# Patient Record
Sex: Female | Born: 1995 | Race: Black or African American | Hispanic: No | Smoking: Never smoker
Health system: Southern US, Community
[De-identification: ages and names within clinical notes are randomized; demographics above are authoritative.]

## PROBLEM LIST (undated history)

## (undated) DIAGNOSIS — G43909 Migraine, unspecified, not intractable, without status migrainosus: Secondary | ICD-10-CM

---
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
## 2015-01-29 ENCOUNTER — Ambulatory Visit: Payer: Self-pay

## 2015-01-29 ENCOUNTER — Ambulatory Visit
Admission: EM | Admit: 2015-01-29 | Discharge: 2015-01-29 | Disposition: A | Discharge status: Home / Self Care | Payer: PRIVATE HEALTH INSURANCE | Attending: Family Medicine | Admitting: Family Medicine

## 2015-01-29 DIAGNOSIS — S161XXA Strain of muscle, fascia and tendon at neck level, initial encounter: Secondary | ICD-10-CM | POA: Diagnosis not present

## 2015-01-29 MED ORDER — MELOXICAM 15 MG PO TABS: 15.0000 mg | ORAL_TABLET | Freq: Every day | ORAL | Status: DC

## 2015-01-29 MED ORDER — ORPHENADRINE CITRATE ER 100 MG PO TB12: 100.0000 mg | ORAL_TABLET | Freq: Two times a day (BID) | ORAL | Status: DC
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
## 2016-05-25 ENCOUNTER — Ambulatory Visit
Admission: EM | Admit: 2016-05-25 | Discharge: 2016-05-25 | Disposition: A | Discharge status: Home / Self Care | Payer: BLUE CROSS/BLUE SHIELD | Attending: Family Medicine | Admitting: Family Medicine

## 2016-05-25 ENCOUNTER — Encounter: Payer: Self-pay | Admitting: *Deleted

## 2016-05-25 DIAGNOSIS — R0602 Shortness of breath: Secondary | ICD-10-CM | POA: Diagnosis not present

## 2016-05-25 DIAGNOSIS — R002 Palpitations: Secondary | ICD-10-CM | POA: Insufficient documentation

## 2016-05-25 DIAGNOSIS — R51 Headache: Secondary | ICD-10-CM | POA: Diagnosis not present

## 2016-05-25 DIAGNOSIS — Z79899 Other long term (current) drug therapy: Secondary | ICD-10-CM | POA: Diagnosis not present

## 2016-05-25 LAB — CBC WITH DIFFERENTIAL/PLATELET
BASOS ABS: 0.1 10*3/uL (ref 0–0.1)
BASOS PCT: 1 %
Eosinophils Absolute: 0.1 10*3/uL (ref 0–0.7)
Eosinophils Relative: 1 %
HEMATOCRIT: 39.6 % (ref 35.0–47.0)
HEMOGLOBIN: 13 g/dL (ref 12.0–16.0)
Lymphocytes Relative: 36 %
Lymphs Abs: 2.4 10*3/uL (ref 1.0–3.6)
MCH: 28.5 pg (ref 26.0–34.0)
MCHC: 32.9 g/dL (ref 32.0–36.0)
MCV: 86.5 fL (ref 80.0–100.0)
Monocytes Absolute: 0.5 10*3/uL (ref 0.2–0.9)
Monocytes Relative: 8 %
NEUTROS ABS: 3.6 10*3/uL (ref 1.4–6.5)
NEUTROS PCT: 54 %
Platelets: 318 10*3/uL (ref 150–440)
RBC: 4.58 MIL/uL (ref 3.80–5.20)
RDW: 13.1 % (ref 11.5–14.5)
WBC: 6.6 10*3/uL (ref 3.6–11.0)

## 2016-05-25 LAB — BASIC METABOLIC PANEL
ANION GAP: 8 (ref 5–15)
BUN: 10 mg/dL (ref 6–20)
CALCIUM: 9.2 mg/dL (ref 8.9–10.3)
CO2: 28 mmol/L (ref 22–32)
Chloride: 102 mmol/L (ref 101–111)
Creatinine, Ser: 0.59 mg/dL (ref 0.44–1.00)
GFR calc non Af Amer: >60 mL/min (ref >60)
Glucose, Bld: 92 mg/dL (ref 65–99)
Potassium: 3.7 mmol/L (ref 3.5–5.1)
Sodium: 138 mmol/L (ref 135–145)

## 2016-05-25 NOTE — Discharge Instructions (Signed)
Follow up with PCP for further workup.

## 2016-05-25 NOTE — ED Provider Notes (Signed)
[facility]    CSN: [id number] Arrival date & time: 05/25/16  4098     History   Chief Complaint Chief Complaint  Patient presents with  . Palpitations  . Headache  . Shortness of Breath    HPI Gina Parsons is a 21 y.o. female.   The history is provided by the patient.  Palpitations  Palpitations quality:  Fast Onset quality:  Sudden Duration:  10 minutes Timing:  Sporadic Progression:  Resolved (occurred yesterday (last night); currently not feeling rapid heartbeat or palpitations) Chronicity:  New Context comment:  Bending over while at work yesterday Relieved by:  None tried Ineffective treatments:  None tried Associated symptoms: shortness of breath   Associated symptoms: no back pain, no chest pain, no chest pressure, no cough, no diaphoresis, no dizziness, no hemoptysis, no leg pain, no lower extremity edema, no malaise/fatigue, no nausea, no near-syncope and no numbness   Risk factors: no diabetes mellitus, no heart disease, no hx of atrial fibrillation, no hx of DVT, no hx of PE, no hx of thyroid disease, no hypercoagulable state, no hyperthyroidism, no OTC sinus medications and no stress   Headache  Associated symptoms: no back pain, no cough, no dizziness, no nausea, no near-syncope and no numbness   Shortness of Breath  Associated symptoms: headaches   Associated symptoms: no chest pain, no cough, no diaphoresis and no hemoptysis     History reviewed. No pertinent past medical history.  There are no active problems to display for this patient.   History reviewed. No pertinent surgical history.  OB History    No data available       Home Medications    Prior to Admission medications   Medication Sig Start Date End Date Taking? Authorizing Provider  meloxicam (MOBIC) 15 MG tablet Take 1 tablet (15 mg total) by mouth daily. 01/29/15   Hassan Rowan, MD  orphenadrine (NORFLEX) 100 MG tablet Take 1 tablet (100 mg total) by mouth 2 (two)  times daily. 01/29/15   Hassan Rowan, MD    Family History History reviewed. No pertinent family history.  Social History Social History  Substance Use Topics  . Smoking status: Never Smoker  . Smokeless tobacco: Never Used  . Alcohol use No     Allergies   Patient has no known allergies.   Review of Systems Review of Systems  Constitutional: Negative for diaphoresis and malaise/fatigue.  Respiratory: Positive for shortness of breath. Negative for cough and hemoptysis.   Cardiovascular: Positive for palpitations. Negative for chest pain and near-syncope.  Gastrointestinal: Negative for nausea.  Musculoskeletal: Negative for back pain.  Neurological: Positive for headaches. Negative for dizziness and numbness.     Physical Exam Triage Vital Signs ED Triage Vitals  Enc Vitals Group     BP 05/25/16 0959 (!) 146/84     Pulse Rate 05/25/16 0959 75     Resp 05/25/16 0959 16     Temp 05/25/16 0959 98.9 F (37.2 C)     Temp Source 05/25/16 0959 Oral     SpO2 05/25/16 0959 100 %     Weight 05/25/16 1001 213 lb (96.6 kg)     Height 05/25/16 1001 5\' 6"  (1.676 m)     Head Circumference --      Peak Flow --      Pain Score --      Pain Loc --      Pain Edu? --      Excl. in  GC? --    No data found.   Updated Vital Signs BP (!) 146/84 (BP Location: Left Arm)   Pulse 75   Temp 98.9 F (37.2 C) (Oral)   Resp 16   Ht 5\' 6"  (1.676 m)   Wt 213 lb (96.6 kg)   LMP 05/01/2016 (Approximate)   SpO2 100%   BMI 34.38 kg/m   Visual Acuity Right Eye Distance:   Left Eye Distance:   Bilateral Distance:    Right Eye Near:   Left Eye Near:    Bilateral Near:     Physical Exam  Constitutional: She appears well-developed and well-nourished. No distress.  HENT:  Head: Normocephalic and atraumatic.  Right Ear: Tympanic membrane, external ear and ear canal normal.  Left Ear: Tympanic membrane, external ear and ear canal normal.  Nose: No mucosal edema, rhinorrhea, nose  lacerations, sinus tenderness, nasal deformity, septal deviation or nasal septal hematoma. No epistaxis.  No foreign bodies. Right sinus exhibits no maxillary sinus tenderness and no frontal sinus tenderness. Left sinus exhibits no maxillary sinus tenderness and no frontal sinus tenderness.  Mouth/Throat: Uvula is midline, oropharynx is clear and moist and mucous membranes are normal. No oropharyngeal exudate.  Eyes: Conjunctivae and EOM are normal. Pupils are equal, round, and reactive to light. Right eye exhibits no discharge. Left eye exhibits no discharge. No scleral icterus.  Neck: Normal range of motion. Neck supple. No thyromegaly present.  Cardiovascular: Normal rate, regular rhythm and normal heart sounds.   Pulmonary/Chest: Effort normal and breath sounds normal. No respiratory distress. She has no wheezes. She has no rales.  Lymphadenopathy:    She has no cervical adenopathy.  Skin: She is not diaphoretic.  Nursing note and vitals reviewed.    UC Treatments / Results  Labs (all labs ordered are listed, but only abnormal results are displayed) Labs Reviewed  BASIC METABOLIC PANEL  CBC WITH DIFFERENTIAL/PLATELET    EKG  EKG Interpretation None       Radiology No results found.  Procedures .EKG Date/Time: 05/25/2016 1:07 PM Performed by: Payton MccallumONTY, Dilynn Munroe Authorized by: Payton MccallumONTY, Shelly Shoultz   ECG reviewed by ED Physician in the absence of a cardiologist: yes   Previous ECG:    Previous ECG:  Unavailable Interpretation:    Interpretation: normal   Rate:    ECG rate assessment: normal   Rhythm:    Rhythm: sinus rhythm   Ectopy:    Ectopy: none   QRS:    QRS axis:  Normal Conduction:    Conduction: normal   ST segments:    ST segments:  Normal T waves:    T waves: normal     (including critical care time)  Medications Ordered in UC Medications - No data to display   Initial Impression / Assessment and Plan / UC Course  I have reviewed the triage vital signs  and the nursing notes.  Pertinent labs & imaging results that were available during my care of the patient were reviewed by me and considered in my medical decision making (see chart for details).       Final Clinical Impressions(s) / UC Diagnoses   Final diagnoses:  Palpitations    New Prescriptions Discharge Medication List as of 05/25/2016 11:13 AM     1. Lab/ekg results (normal/negative) and diagnosis reviewed with patient 2. Recommend supportive treatment with increased fluids 3. Follow-up with PCP for further work up (and  prn if symptoms worsen/recur)   Payton Mccallumrlando Sheneka Schrom, MD 05/25/16 1308

## 2016-05-25 NOTE — ED Triage Notes (Signed)
Last night, sudden onset heart palpitation, states "it felt like my heart was racing", dyspnea and headache. Symptoms continue this morning but not as pronounced.
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
## 2017-01-03 ENCOUNTER — Emergency Department: Payer: BLUE CROSS/BLUE SHIELD

## 2017-01-03 ENCOUNTER — Emergency Department
Admission: EM | Admit: 2017-01-03 | Discharge: 2017-01-03 | Disposition: A | Discharge status: Home / Self Care | Payer: BLUE CROSS/BLUE SHIELD | Attending: Emergency Medicine | Admitting: Emergency Medicine

## 2017-01-03 DIAGNOSIS — R509 Fever, unspecified: Secondary | ICD-10-CM | POA: Insufficient documentation

## 2017-01-03 DIAGNOSIS — J189 Pneumonia, unspecified organism: Secondary | ICD-10-CM | POA: Diagnosis not present

## 2017-01-03 DIAGNOSIS — R110 Nausea: Secondary | ICD-10-CM | POA: Diagnosis not present

## 2017-01-03 DIAGNOSIS — J181 Lobar pneumonia, unspecified organism: Secondary | ICD-10-CM

## 2017-01-03 DIAGNOSIS — R05 Cough: Secondary | ICD-10-CM | POA: Diagnosis present

## 2017-01-03 LAB — CBC
HEMATOCRIT: 37.2 % (ref 35.0–47.0)
HEMOGLOBIN: 12.7 g/dL (ref 12.0–16.0)
MCH: 29.5 pg (ref 26.0–34.0)
MCHC: 34.2 g/dL (ref 32.0–36.0)
MCV: 86.1 fL (ref 80.0–100.0)
Platelets: 190 10*3/uL (ref 150–440)
RBC: 4.32 MIL/uL (ref 3.80–5.20)
RDW: 13 % (ref 11.5–14.5)
WBC: 5.7 10*3/uL (ref 3.6–11.0)

## 2017-01-03 LAB — BASIC METABOLIC PANEL
Anion gap: 11 (ref 5–15)
BUN: 11 mg/dL (ref 6–20)
CHLORIDE: 102 mmol/L (ref 101–111)
CO2: 22 mmol/L (ref 22–32)
CREATININE: 0.56 mg/dL (ref 0.44–1.00)
Calcium: 8.7 mg/dL — ABNORMAL LOW (ref 8.9–10.3)
GFR calc non Af Amer: >60 mL/min (ref >60)
Glucose, Bld: 95 mg/dL (ref 65–99)
Potassium: 3.6 mmol/L (ref 3.5–5.1)
Sodium: 135 mmol/L (ref 135–145)

## 2017-01-03 MED ORDER — DEXTROSE 5 % IV SOLN
1.0000 g | Freq: Once | INTRAVENOUS | Status: AC
  Administered 2017-01-03: 1 g via INTRAVENOUS
  Filled 2017-01-03: qty 10

## 2017-01-03 MED ORDER — BENZONATATE 100 MG PO CAPS: 100.0000 mg | ORAL_CAPSULE | Freq: Four times a day (QID) | ORAL | 0 refills | Status: DC | PRN

## 2017-01-03 MED ORDER — AZITHROMYCIN 500 MG PO TABS
500.0000 mg | ORAL_TABLET | Freq: Once | ORAL | Status: AC
  Administered 2017-01-03: 500 mg via ORAL
  Filled 2017-01-03: qty 1

## 2017-01-03 MED ORDER — ACETAMINOPHEN 500 MG PO TABS
1000.0000 mg | ORAL_TABLET | Freq: Once | ORAL | Status: AC
  Administered 2017-01-03: 1000 mg via ORAL
  Filled 2017-01-03: qty 2

## 2017-01-03 MED ORDER — AZITHROMYCIN 250 MG PO TABS: ORAL_TABLET | ORAL | 0 refills | Status: AC

## 2017-01-03 NOTE — ED Triage Notes (Signed)
Patient reports cough, fever, chills and nausea for several days.

## 2017-01-03 NOTE — Discharge Instructions (Signed)
Please follow up with the acute care clinic °

## 2017-01-03 NOTE — ED Notes (Signed)

## 2017-01-03 NOTE — ED Notes (Signed)
Patient transported to X-ray 

## 2017-01-03 NOTE — ED Provider Notes (Signed)
[facility] Emergency Department Provider Note   ____________________________________________   First MD Initiated Contact with Patient 01/03/17 0400     (approximate)  I have reviewed the triage vital signs and the nursing notes.   HISTORY  Chief Complaint URI    HPI Gina Parsons is a 21 y.o. female Who comes into the hospital today with a cough for the past 4 days. The patient reports that she had fevers chills and nausea. She has taken NyQuil, DayQuil and Tylenol but she is unable to get better. She tried to figure out what's going on so she decided to come into the hospital. She reports that her cough is been dry and has only had some stuff, occasionally. The patient denies any sick contacts that she knows of. She did not check her temperature at home. She last took NyQuil yesterday around 7 PM. The patient has chest pain when she coughs but denies any pain at this time. She denies any shortness of breath as well. The patient is here today for evaluation.   No past medical history on file.  There are no active problems to display for this patient.   No past surgical history on file.  Prior to Admission medications   Medication Sig Start Date End Date Taking? Authorizing Provider  azithromycin (ZITHROMAX Z-PAK) 250 MG tablet Take 2 tablets (500 mg) on  Day 1,  followed by 1 tablet (250 mg) once daily on Days 2 through 5. 01/03/17 01/08/17  Rebecka ApleyWebster, Allison P, MD  benzonatate (TESSALON PERLES) 100 MG capsule Take 1 capsule (100 mg total) by mouth every 6 (six) hours as needed for cough. 01/03/17   Rebecka ApleyWebster, Allison P, MD    Allergies Patient has no known allergies.  No family history on file.  Social History Social History  Substance Use Topics  . Smoking status: Never Smoker  . Smokeless tobacco: Never Used  . Alcohol use No    Review of Systems  Constitutional:  fever/chills Eyes: No visual changes. ENT: No sore throat. Cardiovascular:  Denies chest pain. Respiratory: cough Gastrointestinal: nausea withNo abdominal pain. no vomiting.  No diarrhea.  No constipation. Genitourinary: Negative for dysuria. Musculoskeletal: Negative for back pain. Skin: Negative for rash. Neurological: Negative for headaches, focal weakness or numbness.   ____________________________________________   PHYSICAL EXAM:  VITAL SIGNS: ED Triage Vitals  Enc Vitals Group     BP 01/03/17 0300 (!) 158/65     Pulse Rate 01/03/17 0300 (!) 111     Resp 01/03/17 0300 18     Temp 01/03/17 0300 (!) 101.9 F (38.8 C)     Temp Source 01/03/17 0300 Oral     SpO2 01/03/17 0300 98 %     Weight 01/03/17 0301 203 lb (92.1 kg)     Height 01/03/17 0301 5\' 6"  (1.676 m)     Head Circumference --      Peak Flow --      Pain Score --      Pain Loc --      Pain Edu? --      Excl. in GC? --     Constitutional: Alert and oriented. Well appearing and in mild distress. Eyes: Conjunctivae are normal. PERRL. EOMI. Head: Atraumatic. Nose: No congestion/rhinnorhea. Mouth/Throat: Mucous membranes are moist.  Oropharynx non-erythematous. Cardiovascular: tachycardia regular rhythm. Grossly normal heart sounds.  Good peripheral circulation. Respiratory: Normal respiratory effort.  No retractions. Lungs CTAB. Gastrointestinal: Soft and nontender. No distention. positive bowel sounds  Musculoskeletal: No lower extremity tenderness nor edema.   Neurologic:  Normal speech and language.  Skin:  Skin is warm, dry and intact.  Psychiatric: Mood and affect are normal.   ____________________________________________   LABS (all labs ordered are listed, but only abnormal results are displayed)  Labs Reviewed  BASIC METABOLIC PANEL - Abnormal; Notable for the following:       Result Value   Calcium 8.7 (*)    All other components within normal limits  CULTURE, BLOOD (ROUTINE X 2)  CULTURE, BLOOD (ROUTINE X 2)  CBC    ____________________________________________  EKG  none ____________________________________________  RADIOLOGY  Dg Chest 2 View  Result Date: 01/03/2017 CLINICAL DATA:  Cough and fever for several days EXAM: CHEST  2 VIEW COMPARISON:  None. FINDINGS: Right middle lobe consolidation, typical of pneumonia. No effusions. Left lung is clear. Normal hilar, mediastinal and cardiac contours. IMPRESSION: Right middle lobe pneumonia. Electronically Signed   By: Ellery Plunk M.D.   On: 01/03/2017 04:29    ____________________________________________   PROCEDURES  Procedure(s) performed: None  Procedures  Critical Care performed: No  ____________________________________________   INITIAL IMPRESSION / ASSESSMENT AND PLAN / ED COURSE  Pertinent labs & imaging results that were available during my care of the patient were reviewed by me and considered in my medical decision making (see chart for details).  this is a 21 year old female who comes into the hospital today with a fever and cough for the past 4 days. I sent the patient for a chest x-ray and appears that she has a right middle lobe pneumonia. I will check some blood work and I will give the patient a dose of ceftriaxone and azithromycin. The patient did receive some Tylenol for her fever. She will be reassessed and if she still feels improved and her vital signs were improved she'll be discharged when she's receive her medications.     the patient's blood work is unremarkable and her repeat vital signs are improved. She'll be discharged home to follow-up with the acute care clinic. ____________________________________________   FINAL CLINICAL IMPRESSION(S) / ED DIAGNOSES  Final diagnoses:  Community acquired pneumonia of right middle lobe of lung (HCC)      NEW MEDICATIONS STARTED DURING THIS VISIT:  New Prescriptions   AZITHROMYCIN (ZITHROMAX Z-PAK) 250 MG TABLET    Take 2 tablets (500 mg) on  Day 1,   followed by 1 tablet (250 mg) once daily on Days 2 through 5.   BENZONATATE (TESSALON PERLES) 100 MG CAPSULE    Take 1 capsule (100 mg total) by mouth every 6 (six) hours as needed for cough.     Note:  This document was prepared using Dragon voice recognition software and may include unintentional dictation errors.    Rebecka Apley, MD 01/03/17 [phone]

## 2017-01-03 NOTE — ED Notes (Signed)
Pt given incentive spirometer with education and teach back.

## 2017-01-08 LAB — CULTURE, BLOOD (ROUTINE X 2)
Culture: NO GROWTH
Culture: NO GROWTH
SPECIAL REQUESTS: ADEQUATE

## 2017-03-09 ENCOUNTER — Ambulatory Visit
Admission: EM | Admit: 2017-03-09 | Discharge: 2017-03-09 | Disposition: A | Discharge status: Home / Self Care | Payer: BLUE CROSS/BLUE SHIELD | Attending: Emergency Medicine | Admitting: Emergency Medicine

## 2017-03-09 ENCOUNTER — Other Ambulatory Visit: Payer: Self-pay

## 2017-03-09 DIAGNOSIS — J029 Acute pharyngitis, unspecified: Secondary | ICD-10-CM

## 2017-03-09 LAB — RAPID STREP SCREEN (MED CTR MEBANE ONLY): STREPTOCOCCUS, GROUP A SCREEN (DIRECT): NEGATIVE

## 2017-03-09 MED ORDER — FLUTICASONE PROPIONATE 50 MCG/ACT NA SUSP: 2.0000 | Freq: Every day | NASAL | 0 refills | Status: AC

## 2017-03-09 MED ORDER — IBUPROFEN 600 MG PO TABS: 600.0000 mg | ORAL_TABLET | Freq: Four times a day (QID) | ORAL | 0 refills | Status: DC | PRN

## 2017-03-09 NOTE — ED Triage Notes (Signed)
Patient complains of sore throat and swollen tonsils that started a couple days ago.

## 2017-03-09 NOTE — Discharge Instructions (Signed)
your rapid strep was negative today, so we have sent off a throat culture.  We will contact you and call in the appropriate antibiotics if your culture comes back positive for an infection requiring antibiotic treatment.  Give us a working phone number. 1 gram of Tylenol and 600 mg ibuprofen together 3-4 times a day as needed for pain.  Make sure you drink plenty of extra fluids.  Some people find salt water gargles and  Traditional Medicinal's "Throat Coat" tea helpful. Take 5 mL of liquid Benadryl and 5 mL of Maalox. Mix it together, and then hold it in your mouth for as long as you can and then swallow. You may do this 4 times a day.   ° °Go to www.goodrx.com to look up your medications. This will give you a list of where you can find your prescriptions at the most affordable prices. Or ask the pharmacist what the cash price is, or if they have any other discount programs available to help make your medication more affordable. This can be less expensive than what you would pay with insurance.   °

## 2017-03-09 NOTE — ED Provider Notes (Signed)
HPI  SUBJECTIVE:  Patient reports sore throat starting 2-3 days ago. Sx worse with swallowing.  Sx better with nothing. Has been taking Goody's powder and Tylenol w/ o relief.  No + Swollen neck glands    + Congestion, rhinorrhea.  No postnasal drip or cough No Myalgias No Headache No Rash     No Recent Strep or mono exposure No Abdominal Pain No reflux sxs No Allergy sxs  No Breathing difficulty, voice changes, sensation of throat swelling shut  no Drooling No Trismus No abx in past month.  No antipyretic in past 4-6 hrs  As medical history negative for recurrent strep, mono, diabetes, hypertension.  She has a 3 of GERD but states it is not bothering her, allergies in the spring. LMP, 2 days ago.  Denies possibility of being pregnant. WGN:FAOZHYQPMD:Patient, No Pcp Per    History reviewed. No pertinent past medical history.  History reviewed. No pertinent surgical history.  History reviewed. No pertinent family history.  Social History   Tobacco Use  . Smoking status: Never Smoker  . Smokeless tobacco: Never Used  Substance Use Topics  . Alcohol use: No  . Drug use: Not on file    No current facility-administered medications for this encounter.   Current Outpatient Medications:  .  fluticasone (FLONASE) 50 MCG/ACT nasal spray, Place 2 sprays daily into both nostrils., Disp: 16 g, Rfl: 0 .  ibuprofen (ADVIL,MOTRIN) 600 MG tablet, Take 1 tablet (600 mg total) every 6 (six) hours as needed by mouth., Disp: 30 tablet, Rfl: 0  No Known Allergies   ROS  As noted in HPI.   Physical Exam  BP (!) 148/86 (BP Location: Left Arm)   Pulse 66   Temp 98.4 F (36.9 C) (Oral)   Resp 18   Ht 5\' 6"  (1.676 m)   Wt 202 lb (91.6 kg)   LMP 03/07/2017   SpO2 100%   BMI 32.60 kg/m   Constitutional: Well developed, well nourished, no acute distress Eyes:  EOMI, conjunctiva normal bilaterally HENT: Normocephalic, atraumatic,mucus membranes moist. +  nasal congestion +  erythematous oropharynx - enlarged tonsils - exudates. Uvula midline.  Respiratory: Normal inspiratory effort Cardiovascular: Normal rate, no murmurs, rubs, gallops GI: nondistended, nontender. No appreciable splenomegaly skin: No rash, skin intact Lymph: + cervical LN  Musculoskeletal: no deformities Neurologic: Alert & oriented x 3, no focal neuro deficits Psychiatric: Speech and behavior appropriate.   ED Course   Medications - No data to display  Orders Placed This Encounter  Procedures  . Rapid strep screen    Standing Status:   Standing    Number of Occurrences:   1    Order Specific Question:   Patient immune status    Answer:   Normal  . Culture, group A strep    Standing Status:   Standing    Number of Occurrences:   1    Results for orders placed or performed during the hospital encounter of 03/09/17 (from the past 24 hour(s))  Rapid strep screen     Status: None   Collection Time: 03/09/17  9:04 AM  Result Value Ref Range   Streptococcus, Group A Screen (Direct) NEGATIVE NEGATIVE   No results found.  ED Clinical Impression  Pharyngitis, unspecified etiology   ED Assessment/Plan   Rapid strep negative. Obtaining throat culture to guide antibiotic treatment. Discussed this with patient. We'll contact them if culture is positive, and will call in Appropriate antibiotics. Patient home with ibuprofen, Tylenol,  benadryl/Maalox mixture. Patient to followup with PMD when necessary, will refer to local primary care resources.  Discussed labs,  MDM, plan and followup with patient. Discussed sn/sx that should prompt return to the ED. patient agrees with plan.   Meds ordered this encounter  Medications  . fluticasone (FLONASE) 50 MCG/ACT nasal spray    Sig: Place 2 sprays daily into both nostrils.    Dispense:  16 g    Refill:  0  . ibuprofen (ADVIL,MOTRIN) 600 MG tablet    Sig: Take 1 tablet (600 mg total) every 6 (six) hours as needed by mouth.    Dispense:  30  tablet    Refill:  0     *This clinic note was created using Scientist, clinical (histocompatibility and immunogenetics)Dragon dictation software. Therefore, there may be occasional mistakes despite careful proofreading.    Domenick GongMortenson, Randie Tallarico, MD 03/09/17 [phone]

## 2017-03-11 LAB — CULTURE, GROUP A STREP (THRC)

## 2018-09-01 IMAGING — CR DG CHEST 2V
2 series · 2 of 2 positions shown · non-contrast
Comparison: None.

CLINICAL DATA: Cough and fever for several days

EXAM:
CHEST  2 VIEW

[chest pa]
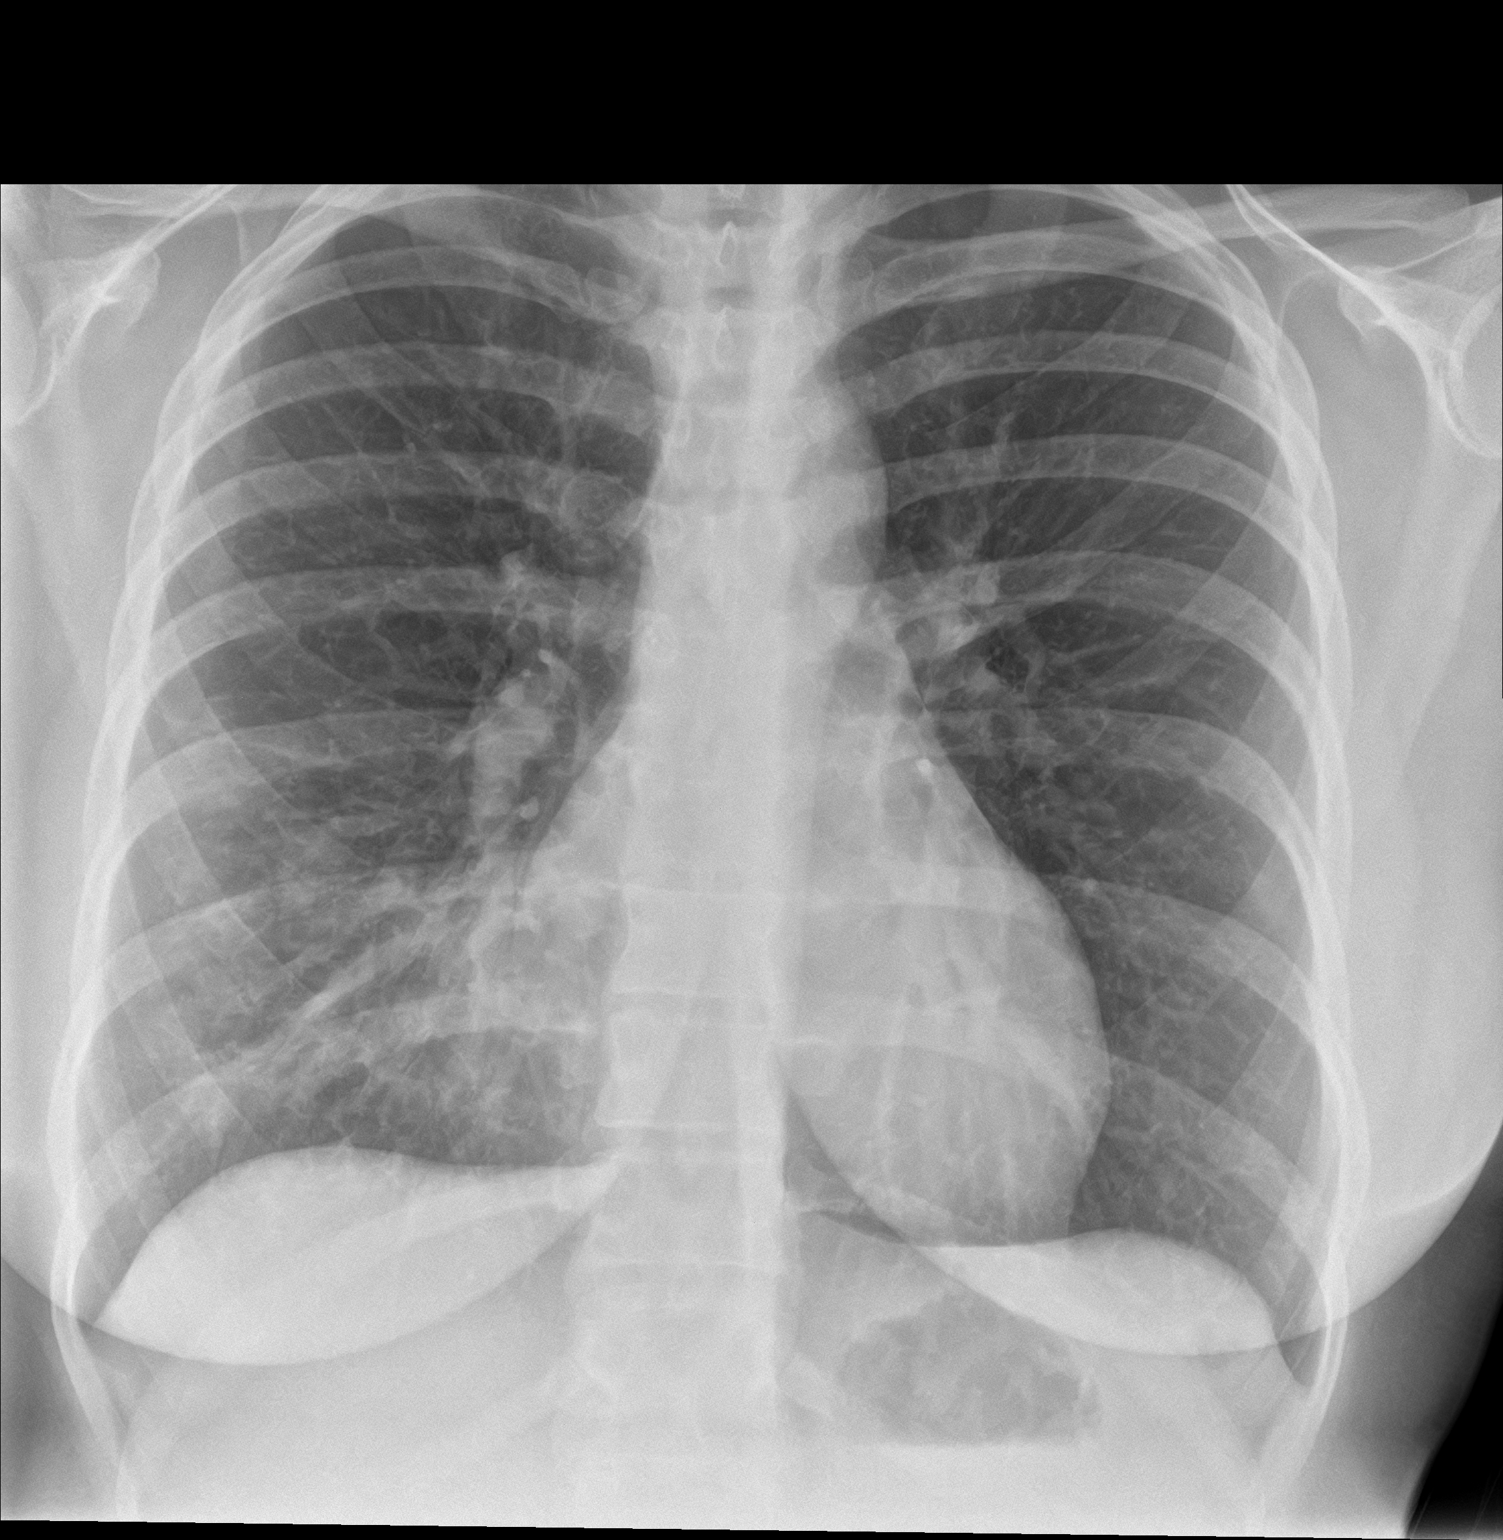

[chest lat]
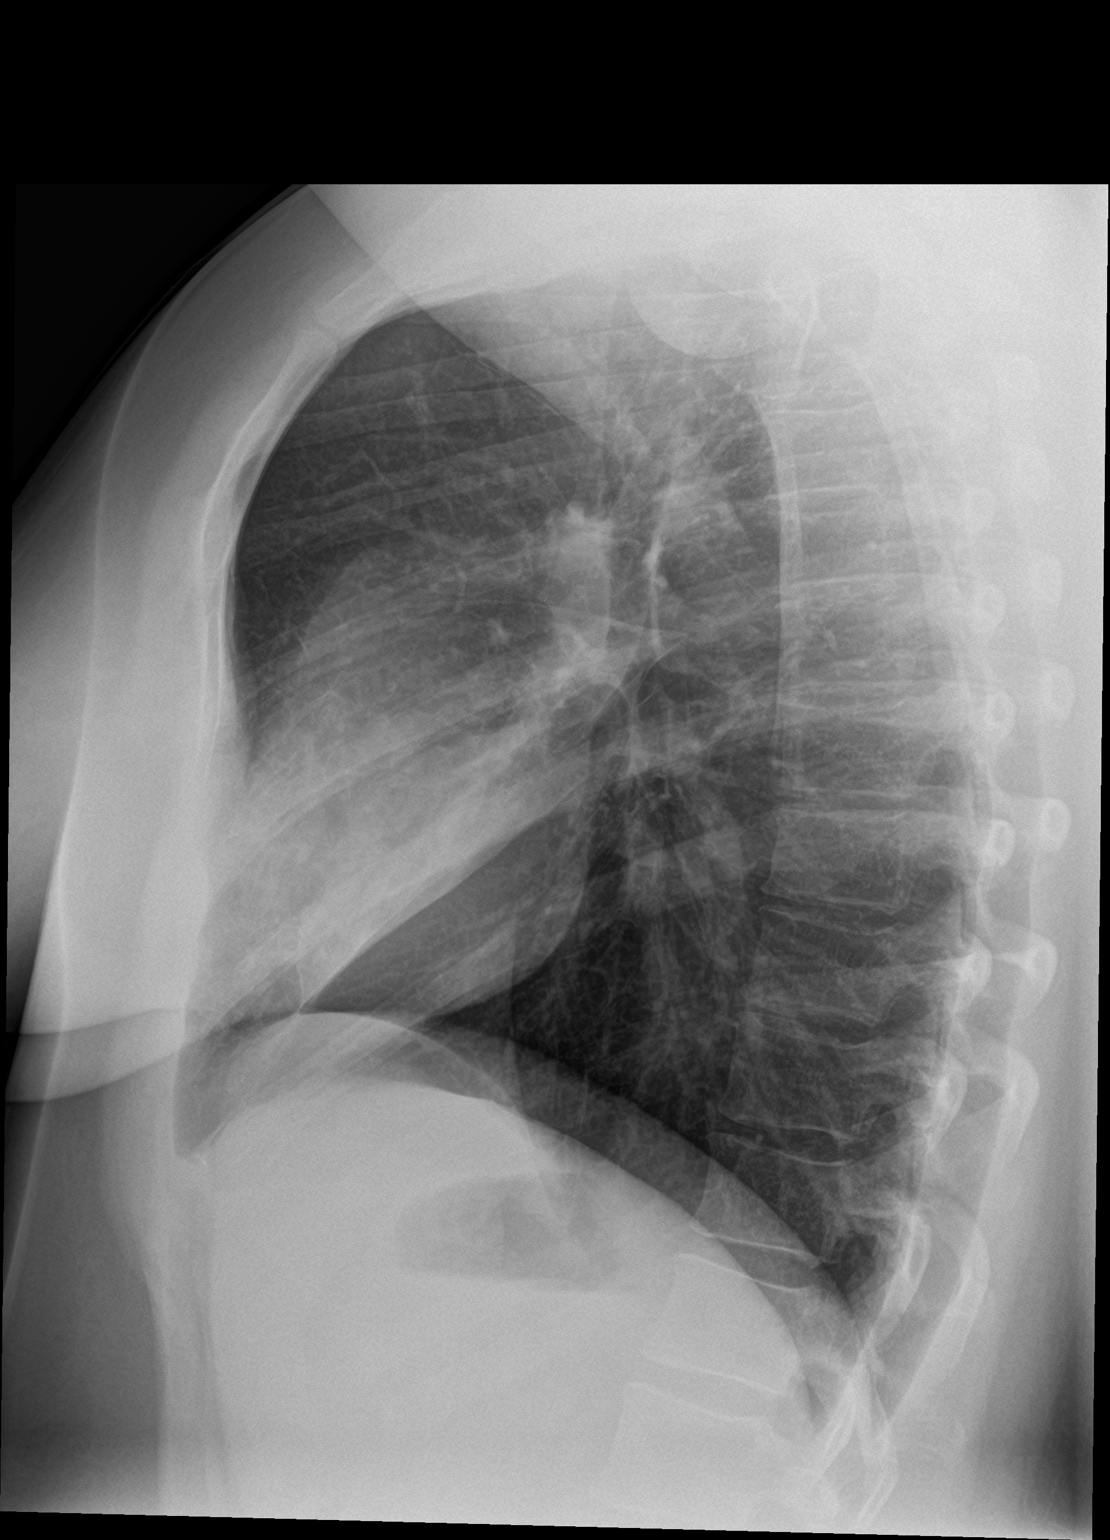

[2 of 2 positions shown; findings below may reference images not displayed]

FINDINGS: Right middle lobe consolidation, typical of pneumonia. No effusions.
Left lung is clear. Normal hilar, mediastinal and cardiac contours.
IMPRESSION: Right middle lobe pneumonia.

## 2019-05-03 NOTE — L&D Delivery Note (Signed)
Delivery Note  Gina Parsons is a G1P1001 at [redacted]w[redacted]d with an LMP of 02/20/19, consistent with early [place].   First Stage: Labor onset: 1230 Induction: Misoprostol, IP folely, oxytocin, AROM  Analgesia /Anesthesia intrapartum: epidural AROM at 1232  Second Stage: Complete dilation at 11/12/2019 2218 Onset of pushing at 11/12/2019 2225 FHR second stage 125bpm with moderate variability, intermittent lates and variables  Gina Parsons presented to L&D for scheduled IOL d/t gestational HTN.  Magnesium sulfate infusion was started for severe range blood pressures requiring treatment with antihypertensives. Her labor became more painful after AROM.  She progressed quickly in the active phase from 6cm to C/C/+2.  Gina Parsons had a spontaneous urge to push and had good maternal effort.   Delivery of a viable baby girl 11/13/2019 at 0029 by CNM Delivery of fetal head in OA position with restitution to LOT. No nuchal cord;  Anterior then posterior shoulders delivered easily with gentle downward traction. Right compound hand presentation noted with delivery.  Baby placed on mom's chest, and attended to by baby nurse.  Cord double clamped after cessation of pulsation, cut by father of baby.   Cord blood sample collected: O neg   Third Stage: Oxytocin bolus started after delivery of infant for hemorrhage prophylaxis  Placenta delivered intact with 3 VC @ 0035 Placenta disposition: discarded Uterine tone firm / bleeding moderate  1st degree right vaginal sidewall and right labial laceration identified  Anesthesia for repair: epidural Repair with 2-0 Chromic CT for vaginal sidewall laceration, 3-0 Chromic CT used for repair of right labial laceration.  Est. Blood Loss (mL): 150 ml  Complications: Preeclampsia with severe features, magnesium sulfate infusion  Mom to postpartum.  Baby to Couplet care / Skin to Skin.  Newborn: Birth Weight: 6lbs 7.7oz, 2940 g Apgar Scores: 8/9 Feeding planned: Breast    ---------- Gina Parsons, CNM Certified Nurse Midwife [facility] OB[facility]N [facility]

## 2019-06-10 DIAGNOSIS — O0993 Supervision of high risk pregnancy, unspecified, third trimester: Secondary | ICD-10-CM | POA: Insufficient documentation

## 2019-06-10 LAB — OB RESULTS CONSOLE RUBELLA ANTIBODY, IGM: Rubella: IMMUNE

## 2019-06-10 LAB — OB RESULTS CONSOLE HEPATITIS B SURFACE ANTIGEN: Hepatitis B Surface Ag: NEGATIVE

## 2019-06-10 LAB — OB RESULTS CONSOLE VARICELLA ZOSTER ANTIBODY, IGG: Varicella: IMMUNE

## 2019-11-05 LAB — OB RESULTS CONSOLE GBS: GBS: NEGATIVE

## 2019-11-05 LAB — OB RESULTS CONSOLE HIV ANTIBODY (ROUTINE TESTING): HIV: NONREACTIVE

## 2019-11-05 LAB — OB RESULTS CONSOLE GC/CHLAMYDIA
Chlamydia: NEGATIVE
Gonorrhea: NEGATIVE

## 2019-11-06 ENCOUNTER — Other Ambulatory Visit: Payer: Self-pay | Admitting: Obstetrics and Gynecology

## 2019-11-06 NOTE — Progress Notes (Signed)
Dating: EDD: 11/27/19  by LMP: 02/20/19 and c/w [place] at 11+1 wks.   Preg c/b:  1. History of anxiety   EPDS at NOB - 11   No current medications   EPDS at 27 weeks 12 - pt feels stable and declines medication or counseling at this time 2. Gestational Hypertension- new onset at 31.6wks  6/1 CBC- platelets 247, CMP-wnl and P/C ratio: 80  Growth [place] ordered 6/1  Home b/p cuff  Weekly NST/AFI  3. RH negative, rhogam given at 28wks 4. Prepregnancy BMI 34  Early glucola-89  Baseline CMP and urine P/C ratio - WNL  Low dose ASA therapy   Prenatal Labs: Blood type/Rh O Neg  Antibody screen Neg, rhogam given 09/03/19  Rubella Immune  Varicella Immune  RPR NR  HBsAg Neg  HIV NR  GC neg  Chlamydia neg  Genetic screening negative  1 hour GTT 85   3 hour GTT  n/a  GBS  pending done 11/05/19   Tdap/flu: declined contraception: undecided Feeding plan: breast

## 2019-11-11 ENCOUNTER — Other Ambulatory Visit: Payer: Self-pay | Admitting: Certified Nurse Midwife

## 2019-11-11 ENCOUNTER — Other Ambulatory Visit
Admission: RE | Admit: 2019-11-11 | Discharge: 2019-11-11 | Disposition: A | Discharge status: Home / Self Care | Payer: BC Managed Care – PPO | Source: Ambulatory Visit | Attending: Obstetrics and Gynecology | Admitting: Obstetrics and Gynecology

## 2019-11-11 ENCOUNTER — Encounter: Payer: Self-pay | Admitting: Certified Nurse Midwife

## 2019-11-11 ENCOUNTER — Other Ambulatory Visit: Payer: Self-pay

## 2019-11-11 DIAGNOSIS — Z20822 Contact with and (suspected) exposure to covid-19: Secondary | ICD-10-CM | POA: Insufficient documentation

## 2019-11-11 LAB — SARS CORONAVIRUS 2 (TAT 6-24 HRS): SARS Coronavirus 2: NEGATIVE

## 2019-11-12 ENCOUNTER — Inpatient Hospital Stay: Payer: BC Managed Care – PPO | Admitting: Anesthesiology

## 2019-11-12 ENCOUNTER — Inpatient Hospital Stay
Admission: EM | Admit: 2019-11-12 | Discharge: 2019-11-15 | DRG: 807 | Disposition: A | Discharge status: Home / Self Care | Payer: BC Managed Care – PPO | Attending: Obstetrics and Gynecology | Admitting: Obstetrics and Gynecology

## 2019-11-12 ENCOUNTER — Encounter: Payer: Self-pay | Admitting: Anesthesiology

## 2019-11-12 ENCOUNTER — Encounter: Payer: Self-pay | Admitting: Obstetrics and Gynecology

## 2019-11-12 DIAGNOSIS — O322XX0 Maternal care for transverse and oblique lie, not applicable or unspecified: Secondary | ICD-10-CM | POA: Diagnosis present

## 2019-11-12 DIAGNOSIS — Z20822 Contact with and (suspected) exposure to covid-19: Secondary | ICD-10-CM | POA: Diagnosis present

## 2019-11-12 DIAGNOSIS — O9902 Anemia complicating childbirth: Secondary | ICD-10-CM | POA: Diagnosis present

## 2019-11-12 DIAGNOSIS — O133 Gestational [pregnancy-induced] hypertension without significant proteinuria, third trimester: Secondary | ICD-10-CM | POA: Diagnosis present

## 2019-11-12 DIAGNOSIS — O134 Gestational [pregnancy-induced] hypertension without significant proteinuria, complicating childbirth: Secondary | ICD-10-CM | POA: Diagnosis present

## 2019-11-12 DIAGNOSIS — O165 Unspecified maternal hypertension, complicating the puerperium: Secondary | ICD-10-CM | POA: Diagnosis present

## 2019-11-12 DIAGNOSIS — Z6791 Unspecified blood type, Rh negative: Secondary | ICD-10-CM | POA: Diagnosis not present

## 2019-11-12 DIAGNOSIS — D649 Anemia, unspecified: Secondary | ICD-10-CM | POA: Diagnosis present

## 2019-11-12 DIAGNOSIS — Z3A37 37 weeks gestation of pregnancy: Secondary | ICD-10-CM

## 2019-11-12 DIAGNOSIS — O26893 Other specified pregnancy related conditions, third trimester: Secondary | ICD-10-CM | POA: Diagnosis present

## 2019-11-12 DIAGNOSIS — D72829 Elevated white blood cell count, unspecified: Secondary | ICD-10-CM | POA: Diagnosis present

## 2019-11-12 HISTORY — DX: Migraine, unspecified, not intractable, without status migrainosus: G43.909

## 2019-11-12 LAB — COMPREHENSIVE METABOLIC PANEL
ALT: 12 U/L (ref 0–44)
ALT: 11 U/L (ref 0–44)
ALT: 11 U/L (ref 0–44)
AST: 18 U/L (ref 15–41)
AST: 19 U/L (ref 15–41)
AST: 19 U/L (ref 15–41)
Albumin: 2.9 g/dL — ABNORMAL LOW (ref 3.5–5.0)
Albumin: 2.7 g/dL — ABNORMAL LOW (ref 3.5–5.0)
Albumin: 2.8 g/dL — ABNORMAL LOW (ref 3.5–5.0)
Alkaline Phosphatase: 91 U/L (ref 38–126)
Alkaline Phosphatase: 90 U/L (ref 38–126)
Alkaline Phosphatase: 88 U/L (ref 38–126)
Anion gap: 10 (ref 5–15)
Anion gap: 10 (ref 5–15)
Anion gap: 11 (ref 5–15)
BUN: 8 mg/dL (ref 6–20)
BUN: 5 mg/dL — ABNORMAL LOW (ref 6–20)
BUN: <5 mg/dL — ABNORMAL LOW (ref 6–20)
CO2: 22 mmol/L (ref 22–32)
CO2: 23 mmol/L (ref 22–32)
CO2: 23 mmol/L (ref 22–32)
Calcium: 8.7 mg/dL — ABNORMAL LOW (ref 8.9–10.3)
Calcium: 8.7 mg/dL — ABNORMAL LOW (ref 8.9–10.3)
Calcium: 8.1 mg/dL — ABNORMAL LOW (ref 8.9–10.3)
Chloride: 103 mmol/L (ref 98–111)
Chloride: 102 mmol/L (ref 98–111)
Chloride: 101 mmol/L (ref 98–111)
Creatinine, Ser: 0.59 mg/dL (ref 0.44–1.00)
Creatinine, Ser: 0.65 mg/dL (ref 0.44–1.00)
Creatinine, Ser: 0.66 mg/dL (ref 0.44–1.00)
GFR calc Af Amer: >60 mL/min (ref >60)
GFR calc Af Amer: >60 mL/min (ref >60)
GFR calc Af Amer: >60 mL/min (ref >60)
GFR calc non Af Amer: >60 mL/min (ref >60)
GFR calc non Af Amer: >60 mL/min (ref >60)
GFR calc non Af Amer: >60 mL/min (ref >60)
Glucose, Bld: 80 mg/dL (ref 70–99)
Glucose, Bld: 94 mg/dL (ref 70–99)
Glucose, Bld: 104 mg/dL — ABNORMAL HIGH (ref 70–99)
Potassium: 3.7 mmol/L (ref 3.5–5.1)
Potassium: 3.6 mmol/L (ref 3.5–5.1)
Potassium: 4 mmol/L (ref 3.5–5.1)
Sodium: 135 mmol/L (ref 135–145)
Sodium: 135 mmol/L (ref 135–145)
Sodium: 135 mmol/L (ref 135–145)
Total Bilirubin: 1 mg/dL (ref 0.3–1.2)
Total Bilirubin: 1.1 mg/dL (ref 0.3–1.2)
Total Bilirubin: 1.3 mg/dL — ABNORMAL HIGH (ref 0.3–1.2)
Total Protein: 6.6 g/dL (ref 6.5–8.1)
Total Protein: 6.2 g/dL — ABNORMAL LOW (ref 6.5–8.1)
Total Protein: 6.4 g/dL — ABNORMAL LOW (ref 6.5–8.1)

## 2019-11-12 LAB — TYPE AND SCREEN
ABO/RH(D): O NEG
Antibody Screen: NEGATIVE

## 2019-11-12 LAB — CBC
HCT: 29.4 % — ABNORMAL LOW (ref 36.0–46.0)
HCT: 28.2 % — ABNORMAL LOW (ref 36.0–46.0)
HCT: 29.6 % — ABNORMAL LOW (ref 36.0–46.0)
Hemoglobin: 9.3 g/dL — ABNORMAL LOW (ref 12.0–15.0)
Hemoglobin: 8.9 g/dL — ABNORMAL LOW (ref 12.0–15.0)
Hemoglobin: 9.3 g/dL — ABNORMAL LOW (ref 12.0–15.0)
MCH: 25.5 pg — ABNORMAL LOW (ref 26.0–34.0)
MCH: 25.6 pg — ABNORMAL LOW (ref 26.0–34.0)
MCH: 25.5 pg — ABNORMAL LOW (ref 26.0–34.0)
MCHC: 31.6 g/dL (ref 30.0–36.0)
MCHC: 31.6 g/dL (ref 30.0–36.0)
MCHC: 31.4 g/dL (ref 30.0–36.0)
MCV: 80.8 fL (ref 80.0–100.0)
MCV: 81 fL (ref 80.0–100.0)
MCV: 81.3 fL (ref 80.0–100.0)
Platelets: 261 10*3/uL (ref 150–400)
Platelets: 229 10*3/uL (ref 150–400)
Platelets: 233 10*3/uL (ref 150–400)
RBC: 3.64 MIL/uL — ABNORMAL LOW (ref 3.87–5.11)
RBC: 3.48 MIL/uL — ABNORMAL LOW (ref 3.87–5.11)
RBC: 3.64 MIL/uL — ABNORMAL LOW (ref 3.87–5.11)
RDW: 14.1 % (ref 11.5–15.5)
RDW: 14.3 % (ref 11.5–15.5)
RDW: 14.3 % (ref 11.5–15.5)
WBC: 7.6 10*3/uL (ref 4.0–10.5)
WBC: 8.3 10*3/uL (ref 4.0–10.5)
WBC: 9.8 10*3/uL (ref 4.0–10.5)
nRBC: 0 % (ref 0.0–0.2)
nRBC: 0 % (ref 0.0–0.2)
nRBC: 0 % (ref 0.0–0.2)

## 2019-11-12 LAB — ABO/RH: ABO/RH(D): O NEG

## 2019-11-12 LAB — PROTEIN / CREATININE RATIO, URINE
Creatinine, Urine: 239 mg/dL
Protein Creatinine Ratio: 0.06 mg/mg{Cre} (ref 0.00–0.15)
Total Protein, Urine: 14 mg/dL

## 2019-11-12 LAB — MAGNESIUM: Magnesium: 4.4 mg/dL — ABNORMAL HIGH (ref 1.7–2.4)

## 2019-11-12 LAB — URIC ACID: Uric Acid, Serum: 5.9 mg/dL (ref 2.5–7.1)

## 2019-11-12 LAB — LACTATE DEHYDROGENASE: LDH: 150 U/L (ref 98–192)

## 2019-11-12 MED ORDER — BUTORPHANOL TARTRATE 1 MG/ML IJ SOLN
1.0000 mg | INTRAMUSCULAR | Status: DC | PRN
  Administered 2019-11-12: 1 mg via INTRAVENOUS
  Filled 2019-11-12: qty 1

## 2019-11-12 MED ORDER — MISOPROSTOL 25 MCG QUARTER TABLET
25.0000 ug | ORAL_TABLET | ORAL | Status: DC | PRN
  Administered 2019-11-12: 25 ug via VAGINAL
  Filled 2019-11-12: qty 1

## 2019-11-12 MED ORDER — AMMONIA AROMATIC IN INHA
RESPIRATORY_TRACT | Status: AC
  Filled 2019-11-12: qty 10

## 2019-11-12 MED ORDER — FENTANYL 2.5 MCG/ML W/ROPIVACAINE 0.15% IN NS 100 ML EPIDURAL (ARMC): 12.0000 mL/h | EPIDURAL | Status: DC

## 2019-11-12 MED ORDER — OXYTOCIN-SODIUM CHLORIDE 30-0.9 UT/500ML-% IV SOLN
1.0000 m[IU]/min | INTRAVENOUS | Status: DC
  Administered 2019-11-12: 2 m[IU]/min via INTRAVENOUS

## 2019-11-12 MED ORDER — MISOPROSTOL 25 MCG QUARTER TABLET
25.0000 ug | ORAL_TABLET | ORAL | Status: DC | PRN
  Administered 2019-11-12: 25 ug via BUCCAL
  Filled 2019-11-12: qty 1

## 2019-11-12 MED ORDER — MAGNESIUM SULFATE 40 GM/1000ML IV SOLN
2.0000 g/h | INTRAVENOUS | Status: DC
  Administered 2019-11-12 – 2019-11-13 (×2): 2 g/h via INTRAVENOUS
  Filled 2019-11-12 (×2): qty 1000

## 2019-11-12 MED ORDER — PHENYLEPHRINE 40 MCG/ML (10ML) SYRINGE FOR IV PUSH (FOR BLOOD PRESSURE SUPPORT): 80.0000 ug | PREFILLED_SYRINGE | INTRAVENOUS | Status: DC | PRN

## 2019-11-12 MED ORDER — LACTATED RINGERS IV SOLN: INTRAVENOUS | Status: DC

## 2019-11-12 MED ORDER — LACTATED RINGERS IV SOLN
500.0000 mL | INTRAVENOUS | Status: DC | PRN
  Administered 2019-11-12: 500 mL via INTRAVENOUS

## 2019-11-12 MED ORDER — MISOPROSTOL 200 MCG PO TABS
ORAL_TABLET | ORAL | Status: AC
  Filled 2019-11-12: qty 4

## 2019-11-12 MED ORDER — HYDRALAZINE HCL 20 MG/ML IJ SOLN: 10.0000 mg | INTRAMUSCULAR | Status: DC | PRN

## 2019-11-12 MED ORDER — LIDOCAINE HCL (PF) 1 % IJ SOLN
INTRAMUSCULAR | Status: DC | PRN
  Administered 2019-11-12: 2 mL

## 2019-11-12 MED ORDER — CALCIUM GLUCONATE 10 % IV SOLN
INTRAVENOUS | Status: AC
  Filled 2019-11-12: qty 10

## 2019-11-12 MED ORDER — FENTANYL 2.5 MCG/ML W/ROPIVACAINE 0.15% IN NS 100 ML EPIDURAL (ARMC)
EPIDURAL | Status: DC | PRN
  Administered 2019-11-12: 12 mL/h via EPIDURAL

## 2019-11-12 MED ORDER — OXYTOCIN 10 UNIT/ML IJ SOLN
INTRAMUSCULAR | Status: AC
  Filled 2019-11-12: qty 2

## 2019-11-12 MED ORDER — SODIUM CHLORIDE 0.9 % IV SOLN
INTRAVENOUS | Status: DC | PRN
  Administered 2019-11-12 (×3): 5 mL via EPIDURAL

## 2019-11-12 MED ORDER — ONDANSETRON HCL 4 MG/2ML IJ SOLN: 4.0000 mg | Freq: Four times a day (QID) | INTRAMUSCULAR | Status: DC | PRN

## 2019-11-12 MED ORDER — MAGNESIUM SULFATE BOLUS VIA INFUSION
4.0000 g | Freq: Once | INTRAVENOUS | Status: AC
  Administered 2019-11-12: 4 g via INTRAVENOUS
  Filled 2019-11-12: qty 1000

## 2019-11-12 MED ORDER — LIDOCAINE HCL (PF) 1 % IJ SOLN
30.0000 mL | INTRAMUSCULAR | Status: DC | PRN
  Filled 2019-11-12: qty 30

## 2019-11-12 MED ORDER — LABETALOL HCL 5 MG/ML IV SOLN
40.0000 mg | INTRAVENOUS | Status: DC | PRN
  Administered 2019-11-12: 40 mg via INTRAVENOUS
  Filled 2019-11-12: qty 8

## 2019-11-12 MED ORDER — OXYTOCIN-SODIUM CHLORIDE 30-0.9 UT/500ML-% IV SOLN
2.5000 [IU]/h | INTRAVENOUS | Status: DC
  Filled 2019-11-12 (×2): qty 500

## 2019-11-12 MED ORDER — LABETALOL HCL 5 MG/ML IV SOLN
20.0000 mg | INTRAVENOUS | Status: DC | PRN
  Administered 2019-11-12 – 2019-11-14 (×2): 20 mg via INTRAVENOUS
  Filled 2019-11-12 (×2): qty 4

## 2019-11-12 MED ORDER — EPHEDRINE 5 MG/ML INJ
10.0000 mg | INTRAVENOUS | Status: DC | PRN
  Administered 2019-11-12: 10 mg via INTRAVENOUS
  Filled 2019-11-12: qty 4

## 2019-11-12 MED ORDER — LABETALOL HCL 5 MG/ML IV SOLN: 80.0000 mg | INTRAVENOUS | Status: DC | PRN

## 2019-11-12 MED ORDER — OXYTOCIN BOLUS FROM INFUSION: 333.0000 mL | Freq: Once | INTRAVENOUS | Status: DC

## 2019-11-12 MED ORDER — EPHEDRINE 5 MG/ML INJ: 10.0000 mg | INTRAVENOUS | Status: DC | PRN

## 2019-11-12 MED ORDER — FENTANYL 2.5 MCG/ML W/ROPIVACAINE 0.15% IN NS 100 ML EPIDURAL (ARMC)
EPIDURAL | Status: AC
  Filled 2019-11-12: qty 100

## 2019-11-12 MED ORDER — LACTATED RINGERS IV SOLN
500.0000 mL | Freq: Once | INTRAVENOUS | Status: AC
  Administered 2019-11-12: 500 mL via INTRAVENOUS

## 2019-11-12 MED ORDER — SOD CITRATE-CITRIC ACID 500-334 MG/5ML PO SOLN: 30.0000 mL | ORAL | Status: DC | PRN

## 2019-11-12 MED ORDER — TERBUTALINE SULFATE 1 MG/ML IJ SOLN
0.2500 mg | Freq: Once | INTRAMUSCULAR | Status: AC | PRN
  Administered 2019-11-12: 0.25 mg via SUBCUTANEOUS
  Filled 2019-11-12: qty 1

## 2019-11-12 MED ORDER — DIPHENHYDRAMINE HCL 50 MG/ML IJ SOLN: 12.5000 mg | INTRAMUSCULAR | Status: DC | PRN

## 2019-11-12 MED ORDER — ACETAMINOPHEN 325 MG PO TABS: 650.0000 mg | ORAL_TABLET | ORAL | Status: DC | PRN

## 2019-11-12 NOTE — H&P (Signed)
OB History & Physical   History of Present Illness:  Chief Complaint:   HPI:  MANDA HOLSTAD is a 24 y.o. G1P0 female at [redacted]w[redacted]d dated by LMP.  She presents to L&D for scheduled IOL for gestational HTN  Active FM Denies contractions prior to admission.  Now having irregular cramping  Denies LOF or blood show  Denies headaches, changes in vision, or RUQ pain   Pregnancy Issues: 1. History of anxiety 2. Gestational Hypertension  3. Rh negative 4. Prepregnancy BMI 34   Patient Active Problem List   Diagnosis Date Noted   Gestational hypertension w/o significant proteinuria in 3rd trimester 11/12/2019    Maternal Medical History:   Past Medical History:  Diagnosis Date   Migraines     History reviewed. No pertinent surgical history.  No Known Allergies  Prior to Admission medications   Medication Sig Start Date End Date Taking? Authorizing Provider  aspirin 325 MG tablet Take 325 mg by mouth daily.   Yes [provider]  ondansetron (ZOFRAN) 24 MG tablet Take 24 mg by mouth once.   Yes [provider]  fluticasone (FLONASE) 50 MCG/ACT nasal spray Place 2 sprays daily into both nostrils. 03/09/17   Domenick Gong, MD  ibuprofen (ADVIL,MOTRIN) 600 MG tablet Take 1 tablet (600 mg total) every 6 (six) hours as needed by mouth. 03/09/17   Domenick Gong, MD     Prenatal care site:  [facility]  Social History: She  reports that she has never smoked. She has never used smokeless tobacco. She reports that she does not drink alcohol and does not use drugs.  Family History: No pertinent history, denies history of gyn cancers    Review of Systems: A full review of systems was performed and negative except as noted in the HPI.     Physical Exam:  Vital Signs: BP (!) 144/98    Pulse 70    Temp 98.7 F (37.1 C) (Oral)    Resp 18    Ht 5\' 6"  (1.676 m)    Wt 97.1 kg    LMP 02/20/2019 (Approximate)    BMI 34.54 kg/m    General: no  acute distress.  HEENT: normocephalic, atraumatic Heart: regular rate & rhythm.  No murmurs/rubs/gallops Lungs: clear to auscultation bilaterally, normal respiratory effort Abdomen: soft, gravid, non-tender;  EFW: 6lbs 1/2 lbs  Pelvic:   External: Normal external female genitalia  Cervix: Dilation: 1.5 / Effacement (%): 70 / Station: -2    Extremities: non-tender, symmetric, no edema bilaterally.  DTRs: 2+/2+  Neurologic: Alert & oriented x 3.    Results for orders placed or performed during the hospital encounter of 11/12/19 (from the past 24 hour(s))  Protein / creatinine ratio, urine     Status: None   Collection Time: 11/12/19 12:21 AM  Result Value Ref Range   Creatinine, Urine 239 mg/dL   Total Protein, Urine 14 mg/dL   Protein Creatinine Ratio 0.06 0.00 - 0.15 mg/mg[Cre]  CBC     Status: Abnormal   Collection Time: 11/12/19 12:56 AM  Result Value Ref Range   WBC 7.6 4.0 - 10.5 K/uL   RBC 3.64 (L) 3.87 - 5.11 MIL/uL   Hemoglobin 9.3 (L) 12.0 - 15.0 g/dL   HCT 11/14/19 (L) 36 - 46 %   MCV 80.8 80.0 - 100.0 fL   MCH 25.5 (L) 26.0 - 34.0 pg   MCHC 31.6 30.0 - 36.0 g/dL   RDW 97.3 53.2 -  15.5 %   Platelets 261 150 - 400 K/uL   nRBC 0.0 0.0 - 0.2 %  Comprehensive metabolic panel     Status: Abnormal   Collection Time: 11/12/19 12:56 AM  Result Value Ref Range   Sodium 135 135 - 145 mmol/L   Potassium 3.7 3.5 - 5.1 mmol/L   Chloride 103 98 - 111 mmol/L   CO2 22 22 - 32 mmol/L   Glucose, Bld 80 70 - 99 mg/dL   BUN 8 6 - 20 mg/dL   Creatinine, Ser 1.19 0.44 - 1.00 mg/dL   Calcium 8.7 (L) 8.9 - 10.3 mg/dL   Total Protein 6.6 6.5 - 8.1 g/dL   Albumin 2.9 (L) 3.5 - 5.0 g/dL   AST 18 15 - 41 U/L   ALT 12 0 - 44 U/L   Alkaline Phosphatase 91 38 - 126 U/L   Total Bilirubin 1.0 0.3 - 1.2 mg/dL   GFR calc non Af Amer >60 >60 mL/min   GFR calc Af Amer >60 >60 mL/min   Anion gap 10 5 - 15  Type and screen     Status: None   Collection Time: 11/12/19 12:56 AM  Result Value Ref  Range   ABO/RH(D) O NEG    Antibody Screen NEG    Sample Expiration      11/15/2019,2359 Performed at [facility], [address] [place], [place] [address]   ABO/Rh     Status: None   Collection Time: 11/12/19  1:50 AM  Result Value Ref Range   ABO/RH(D)      O NEG Performed at [facility], [address] [place], [place] [address]     Pertinent Results:  Prenatal Labs: Blood type/Rh O neg  Antibody screen Neg, rhogam given 09/03/2019  Rubella Immune  Varicella Immune  RPR NR  HBsAg Neg  HIV NR  GC neg  Chlamydia neg  Genetic screening negative  1 hour GTT 85  3 hour GTT N/A  GBS Negative    Tdap: declined   FHT: 120bpm, moderate variability, accels present, decels absent  TOCO: contractions 1-45min, mild to palpation  SVE:  2-3/70/-1/soft/mid ->previously FT/50/-3/posterior    Cephalic by leopolds and SVE     Assessment:  AUSTINA CONSTANTIN is a 24 y.o. G1P0 female at [redacted]w[redacted]d with gestational HTN.   Plan:  1. Admit to Labor & Delivery; consents reviewed and obtained  2. Fetal Well being  - Fetal Tracing: cat 1 - Group B Streptococcus ppx indicated: Not indicated, GBS neg - Presentation: cephalic confirmed by SVE    3. Routine OB: - Prenatal labs reviewed, as above - Rh neg - CBC, T&S, RPR on admit - Clear fluids, IVF  4.Induction of Labor -  External toco in place -  Pelvis adequate for trial of labor  -  Plan for induction with misoprostol then oxytocin  - S/P cervical balloon  -  Plan for continuous fetal monitoring  -  Maternal pain control as desired - Anticipate vaginal delivery  5. Gestational HTN - Preeclampsia labs WNL - Mild range blood pressures noted.   - Severe range b/p noted with follow up b/p in mild range   5. Post Partum Planning: - Infant feeding: breast - Contraception: TBD   [name], CNM 11/12/19 8:18 AM  Margaretmary Eddy, CNM Certified Nurse Midwife [place]  [facility]

## 2019-11-12 NOTE — Progress Notes (Signed)
Labor Progress Note   Gina Parsons is a 24 y.o. G1P0 at [redacted]w[redacted]d by LMP admitted for induction of labor due to gestational hypertension.  Subjective: feeling more comfortable after epidural  Objective: BP 111/63   Pulse (!) 108   Temp 97.6 F (36.4 C) (Oral)   Resp 15   Ht 5\' 6"  (1.676 m)   Wt 97.1 kg   LMP 02/20/2019 (Approximate)   SpO2 97%   BMI 34.54 kg/m  Notable VS details: hypotensive following epidural  Fetal Assessment: FHT:  FHR: 120 bpm, variability: moderate,  accelerations:  Present,  decelerations:  Present prolong decel, intermittent variables and earlies  Category/reactivity:  Category II UC:   regular, every 2-5 minutes SVE:   4/80/-2/soft/mid Membrane status: AROM at 1232 Amniotic color: clear fluid   Labs: Lab Results  Component Value Date   WBC 8.3 11/12/2019   HGB 8.9 (L) 11/12/2019   HCT 28.2 (L) 11/12/2019   MCV 81.0 11/12/2019   PLT 229 11/12/2019    Assessment / Plan: Induction of labor d/t gestational HTN, s/p epidural placment, mag sulfate infusing.  Dr. 11/14/2019 updated on prolong decel.  Strip reviewed.  Will place IUPC to better monitor contractions.    Labor: Pitocin turned off d/t prolong decel.  Will plan to restart in [name] - 1 hour. Preeclampsia:  on magnesium sulfate Fetal Wellbeing:  Category II Pain Control:  Epidural I/D:  No s/s, afebrile  , CNM 11/12/2019, 4:53 PM

## 2019-11-12 NOTE — Anesthesia Preprocedure Evaluation (Signed)
Anesthesia Evaluation  Patient identified by MRN, date of birth, ID band Patient awake    Reviewed: Allergy & Precautions, H&P , NPO status , Patient's Chart, lab work & pertinent test results  Airway Mallampati: III  TM Distance: >3 FB Neck ROM: full    Dental  (+) Teeth Intact   Pulmonary neg pulmonary ROS,           Cardiovascular Exercise Tolerance: Good hypertension (gestational),      Neuro/Psych  Headaches,    GI/Hepatic negative GI ROS,   Endo/Other    Renal/GU   negative genitourinary   Musculoskeletal   Abdominal   Peds  Hematology negative hematology ROS (+)   Anesthesia Other Findings Past Medical History: No date: Migraines  History reviewed. No pertinent surgical history.  BMI    Body Mass Index: 34.54 kg/m      Reproductive/Obstetrics (+) Pregnancy                             Anesthesia Physical Anesthesia Plan  ASA: II  Anesthesia Plan: Epidural   Post-op Pain Management:    Induction:   PONV Risk Score and Plan:   Airway Management Planned:   Additional Equipment:   Intra-op Plan:   Post-operative Plan:   Informed Consent: I have reviewed the patients History and Physical, chart, labs and discussed the procedure including the risks, benefits and alternatives for the proposed anesthesia with the patient or authorized representative who has indicated his/her understanding and acceptance.       Plan Discussed with: Anesthesiologist  Anesthesia Plan Comments:         Anesthesia Quick Evaluation

## 2019-11-12 NOTE — Progress Notes (Signed)
Labor Progress Note  Gina Parsons is a 24 y.o. G1P0 at [redacted]w[redacted]d by LMP admitted for induction of labor due to gestational hypertension.  Subjective: comfortable with epidural  Objective: BP 120/65   Pulse 82   Temp 97.6 F (36.4 C) (Oral)   Resp 15   Ht 5\' 6"  (1.676 m)   Wt 97.1 kg   LMP 02/20/2019 (Approximate)   SpO2 100%   BMI 34.54 kg/m  Notable VS details: reviewed, no elevated blood pressures  DTR 2+/2+, 1 beat of clonus present   Fetal Assessment: FHT:  FHR: 105 bpm, variability: moderate,  accelerations:  Present,  decelerations:  Present earlies Category/reactivity:  Category II UC:   regular, every 3-5 minutes - IUPC placed, MVU 140  SVE:   Unchanged  Membrane status: AROM at 1232 Amniotic color: Clear  Labs: Lab Results  Component Value Date   WBC 8.3 11/12/2019   HGB 8.9 (L) 11/12/2019   HCT 28.2 (L) 11/12/2019   MCV 81.0 11/12/2019   PLT 229 11/12/2019    Assessment / Plan: Protracted latent phase, pitocin restarted, IUPC placed. Dr. 11/14/2019 updated on FHR and interventions.  Reviewed tracing. Low baseline at 105bpm with moderate variability and accels.  Will continue to titrate pitocin for adequate labor.    Labor: Pitocin restarted, will titrate for adequate labor  Preeclampsia:  on magnesium sulfate, will recheck labs at 2000  Fetal Wellbeing:  Category II Pain Control:  Epidural I/D:  Afebrile, AROM at 1232  [name], CNM 11/12/2019, 6:46 PM

## 2019-11-12 NOTE — Anesthesia Procedure Notes (Signed)
Epidural Patient location during procedure: OB Start time: 11/12/2019 3:12 PM End time: 11/12/2019 3:24 PM  Staffing Anesthesiologist: Karleen Hampshire, MD Performed: anesthesiologist   Preanesthetic Checklist Completed: patient identified, IV checked, site marked, risks and benefits discussed, surgical consent, monitors and equipment checked, pre-op evaluation and timeout performed  Epidural Patient position: sitting Prep: ChloraPrep Patient monitoring: heart rate, continuous pulse ox and blood pressure Approach: midline Location: L4-L5 Injection technique: LOR saline  Needle:  Needle type: Tuohy  Needle gauge: 18 G Needle length: 9 cm and 9 Needle insertion depth: 7 cm Catheter type: closed end flexible Catheter size: 20 Guage Catheter at skin depth: 11 cm Test dose: negative and Other  Assessment Events: blood not aspirated, injection not painful, no injection resistance, no paresthesia and negative IV test  Additional Notes Risks and benefits of procedure discussed with patient.  Risks including but not limited to infection, spinal/epidural hematoma, nerve injury, post dural puncture headache, and inadequate/failed block.  Patient expressed understanding and consented to epidural placement. Negative dural puncture.  Negative aspiration.  Negative paresthesia on injection.  Dose given in divided aliquots.  Patient tolerated the procedure well with no immediate complications.   Reason for block:procedure for pain

## 2019-11-12 NOTE — Progress Notes (Signed)
Labor Progress Note  Gina Parsons is a 24 y.o. G1P0 at [redacted]w[redacted]d by LMP admitted for induction of labor due to gestational HTN.  Subjective: feeling cramping, a little more uncomfortable with contractions   Objective: BP (!) 147/75    Pulse 70    Temp 98.1 F (36.7 C) (Oral)    Resp 18    Ht 5\' 6"  (1.676 m)    Wt 97.1 kg    LMP 02/20/2019 (Approximate)    BMI 34.54 kg/m  Notable VS details: reviewed, no severe range blood pressures   Fetal Assessment: FHT:  FHR: 120 bpm, variability: moderate,  accelerations:  Present,  decelerations:  Absent, FSE placed d/t difficulty differentiating between maternal and fetal HR.  Category/reactivity:  Category I UC:   regular, every 2-3 minutes, mild to palpation  SVE:  3/80/-1/soft/posterior Membrane status: AROM at 1232 Amniotic color: Clear - scant amount of fluid   Labs: Lab Results  Component Value Date   WBC 8.3 11/12/2019   HGB 8.9 (L) 11/12/2019   HCT 28.2 (L) 11/12/2019   MCV 81.0 11/12/2019   PLT 229 11/12/2019    Assessment / Plan: Induction of labor due to gestational hypertension,  progressing well on pitocin  Labor: Progressing on pitocin, will continue to titrate  Preeclampsia:  no signs or symptoms of toxicity and labs stable Fetal Wellbeing:  Category I - FSE placed d/t difficulty maintaining FHR versus maternal HR.  Several periods are noted to trace maternal heart rate.  Confirmed at bedside and audibly in sync with maternal HR.   Pain Control:  Labor support without medications I/D:  n/a  11/14/2019, CNM 11/12/2019, 12:42 PM

## 2019-11-12 NOTE — Progress Notes (Signed)
  Chaplain On-Call responded to Order Requisition for Advance Directives information for patient.  At 2010, Chaplain met patient and provided the AD documents to her. Encouraged reading and discussions with family and health care team.  Patient is sleepy, and is hopeful that her baby can be delivered on tomorrow.  Orason Jahfari Ambers M.Div., [facility]

## 2019-11-12 NOTE — Progress Notes (Signed)
FSE removed

## 2019-11-12 NOTE — Progress Notes (Signed)
Labor Progress Note  Gina Parsons is a 24 y.o. G1P0 at [redacted]w[redacted]d by LMP admitted for induction of labor due to gestational HTN.  Subjective: feeling increased pressure and urge to push  Objective: BP (!) 115/58   Pulse 77   Temp (!) 97.3 F (36.3 C) (Oral)   Resp 16   Ht 5\' 6"  (1.676 m)   Wt 97.1 kg   LMP 02/20/2019 (Approximate)   SpO2 100%   BMI 34.54 kg/m  Notable VS details: reviewed  Fetal Assessment: FHT:  FHR: 120 bpm, variability: moderate,  accelerations:  Present,  decelerations:  Present Intermittent earlies and variables Category/reactivity:  Category II UC:   regular, every 2-3 minutes SVE:   10/100/+2 Membrane status: AROM at 1232 Amniotic color: Clear   Labs: Lab Results  Component Value Date   WBC 9.8 11/12/2019   HGB 9.3 (L) 11/12/2019   HCT 29.6 (L) 11/12/2019   MCV 81.3 11/12/2019   PLT 233 11/12/2019    Assessment / Plan: Induction of labor d/t gest HTN, progressing normally, will start pushing   Labor: Progressing normally Preeclampsia:  on magnesium sulfate Fetal Wellbeing:  Category II Pain Control:  Epidural I/D:  Afebrile   11/14/2019, CNM 11/12/2019, 10:33 PM

## 2019-11-12 NOTE — Progress Notes (Signed)
Labor Progress Note  Gina Parsons is a 24 y.o. G1P0 at [redacted]w[redacted]d by LMP admitted for induction of labor due to gestational hypertension.  Subjective: feeling more pressure   Objective: BP (!) 115/58    Pulse 77    Temp (!) 97.3 F (36.3 C) (Oral)    Resp 16    Ht 5\' 6"  (1.676 m)    Wt 97.1 kg    LMP 02/20/2019 (Approximate)    SpO2 100%    BMI 34.54 kg/m  Notable VS details: reviewed   Fetal Assessment: FHT:  FHR: 120 bpm, variability: moderate,  accelerations:  Present,  decelerations:  Present intermittent early decelerations, prolong deceleration Category/reactivity:  Category II UC:   regular, every 2-5 minutes, MVU's range from 140-200 SVE:   6/90/-1/soft/ant Membrane status: AROM at 1232 Amniotic color: clear   Labs: Lab Results  Component Value Date   WBC 9.8 11/12/2019   HGB 9.3 (L) 11/12/2019   HCT 29.6 (L) 11/12/2019   MCV 81.3 11/12/2019   PLT 233 11/12/2019    Assessment / Plan: Induction of labor due to gestational hypertension,  progressing well on pitocin  Labor: Progressing on pitcocin  Dr. 11/14/2019 updated on status.   Preeclampsia:  on magnesium sulfate and decreased to 1 gram/hour d/t low blood pressure and low FHR baseline Fetal Wellbeing:  Category II - prolong decel noted on right side.   Repositioned to left side.  Decelerations noted more predominately when Gina Parsons is on her back or right side.   Pain Control:  Epidural I/D:  Afebrile, AROM at 1232   [name], [name] 11/12/2019, 9:35 PM

## 2019-11-12 NOTE — Progress Notes (Signed)
Labor Progress Note  Gina Parsons is a 24 y.o. G1P0 at [redacted]w[redacted]d by LMP admitted for induction of labor due to gestational HTN.  Subjective: hurting more with contractions, IVPM was helpful but feeling more nauseous.  Denies headache, changes in vision, or RUQ pain.    Objective: BP (!) 169/101 Comment: CNM aware  Pulse 90   Temp 98.1 F (36.7 C) (Oral)   Resp 18   Ht 5\' 6"  (1.676 m)   Wt 97.1 kg   LMP 02/20/2019 (Approximate)   BMI 34.54 kg/m  Notable VS details: reviewed.  2 severe range b/p noted, treated with IV Labetalol.   Fetal Assessment: FHT:  FHR: 120 bpm, variability: moderate,  accelerations:  Present,  decelerations:  Absent Category/reactivity:  Category I UC:   regular, every 2-3 minutes SVE:   Deferred  Membrane status: AROM at 1232 Amniotic color: clear, scant amount   Labs: Lab Results  Component Value Date   WBC 8.3 11/12/2019   HGB 8.9 (L) 11/12/2019   HCT 28.2 (L) 11/12/2019   MCV 81.0 11/12/2019   PLT 229 11/12/2019    Assessment / Plan: Induction of labor due to gestational hypertension,  progressing well on pitocin. Plan of care discussed with Dr. 11/14/2019.  Will start magnesium sulfate infusion d/t severe range blood pressures.    Labor: Progressing normally, reporting increased pain with contractions.  Preeclampsia:  Multiple severe range b/p's requiring treatment with IV labetalol.  Starting Mag Sulfate infusion. Fetal Wellbeing:  Category I Pain Control:  IV pain meds I/D:  no s/s   [name], CNM 11/12/2019, 1:42 PM

## 2019-11-13 ENCOUNTER — Encounter: Payer: Self-pay | Admitting: Obstetrics and Gynecology

## 2019-11-13 LAB — CBC
HCT: 29.3 % — ABNORMAL LOW (ref 36.0–46.0)
Hemoglobin: 9.5 g/dL — ABNORMAL LOW (ref 12.0–15.0)
MCH: 26 pg (ref 26.0–34.0)
MCHC: 32.4 g/dL (ref 30.0–36.0)
MCV: 80.1 fL (ref 80.0–100.0)
Platelets: 273 10*3/uL (ref 150–400)
RBC: 3.66 MIL/uL — ABNORMAL LOW (ref 3.87–5.11)
RDW: 14.1 % (ref 11.5–15.5)
WBC: 21.9 10*3/uL — ABNORMAL HIGH (ref 4.0–10.5)
nRBC: 0 % (ref 0.0–0.2)

## 2019-11-13 LAB — COMPREHENSIVE METABOLIC PANEL
ALT: 15 U/L (ref 0–44)
AST: 36 U/L (ref 15–41)
Albumin: 2.8 g/dL — ABNORMAL LOW (ref 3.5–5.0)
Alkaline Phosphatase: 86 U/L (ref 38–126)
Anion gap: 10 (ref 5–15)
BUN: <5 mg/dL — ABNORMAL LOW (ref 6–20)
CO2: 21 mmol/L — ABNORMAL LOW (ref 22–32)
Calcium: 7.6 mg/dL — ABNORMAL LOW (ref 8.9–10.3)
Chloride: 105 mmol/L (ref 98–111)
Creatinine, Ser: 0.72 mg/dL (ref 0.44–1.00)
GFR calc Af Amer: >60 mL/min (ref >60)
GFR calc non Af Amer: >60 mL/min (ref >60)
Glucose, Bld: 140 mg/dL — ABNORMAL HIGH (ref 70–99)
Potassium: 3.9 mmol/L (ref 3.5–5.1)
Sodium: 136 mmol/L (ref 135–145)
Total Bilirubin: 1.4 mg/dL — ABNORMAL HIGH (ref 0.3–1.2)
Total Protein: 6.5 g/dL (ref 6.5–8.1)

## 2019-11-13 LAB — RPR: RPR Ser Ql: NONREACTIVE

## 2019-11-13 MED ORDER — BENZOCAINE-MENTHOL 20-0.5 % EX AERO
1.0000 "application " | INHALATION_SPRAY | CUTANEOUS | Status: DC | PRN
  Administered 2019-11-14: 1 via TOPICAL
  Filled 2019-11-13: qty 56

## 2019-11-13 MED ORDER — SIMETHICONE 80 MG PO CHEW: 80.0000 mg | CHEWABLE_TABLET | ORAL | Status: DC | PRN

## 2019-11-13 MED ORDER — NIFEDIPINE ER OSMOTIC RELEASE 30 MG PO TB24
60.0000 mg | ORAL_TABLET | Freq: Every day | ORAL | Status: DC
  Administered 2019-11-14 – 2019-11-15 (×2): 60 mg via ORAL
  Filled 2019-11-13 (×2): qty 2

## 2019-11-13 MED ORDER — DIPHENHYDRAMINE HCL 25 MG PO CAPS: 25.0000 mg | ORAL_CAPSULE | Freq: Four times a day (QID) | ORAL | Status: DC | PRN

## 2019-11-13 MED ORDER — FLUTICASONE PROPIONATE 50 MCG/ACT NA SUSP
2.0000 | Freq: Every day | NASAL | Status: DC | PRN
  Filled 2019-11-13: qty 16

## 2019-11-13 MED ORDER — SODIUM CHLORIDE 0.9 % IV SOLN
2.5000 [IU]/h | INTRAVENOUS | Status: DC
  Administered 2019-11-13: 2.5 [IU]/h via INTRAVENOUS
  Filled 2019-11-13: qty 3

## 2019-11-13 MED ORDER — ONDANSETRON HCL 4 MG/2ML IJ SOLN: 4.0000 mg | INTRAMUSCULAR | Status: DC | PRN

## 2019-11-13 MED ORDER — COCONUT OIL OIL
1.0000 "application " | TOPICAL_OIL | Status: DC | PRN
  Administered 2019-11-14: 1 via TOPICAL
  Filled 2019-11-13: qty 120

## 2019-11-13 MED ORDER — LACTATED RINGERS IV SOLN: INTRAVENOUS | Status: DC

## 2019-11-13 MED ORDER — DOCUSATE SODIUM 100 MG PO CAPS
100.0000 mg | ORAL_CAPSULE | Freq: Two times a day (BID) | ORAL | Status: DC
  Administered 2019-11-14 (×3): 100 mg via ORAL
  Filled 2019-11-13 (×4): qty 1

## 2019-11-13 MED ORDER — PRENATAL MULTIVITAMIN CH
1.0000 | ORAL_TABLET | Freq: Every day | ORAL | Status: DC
  Administered 2019-11-14 – 2019-11-15 (×2): 1 via ORAL
  Filled 2019-11-13 (×2): qty 1

## 2019-11-13 MED ORDER — ONDANSETRON HCL 4 MG PO TABS: 4.0000 mg | ORAL_TABLET | ORAL | Status: DC | PRN

## 2019-11-13 MED ORDER — FLUTICASONE PROPIONATE 50 MCG/ACT NA SUSP
2.0000 | Freq: Every day | NASAL | Status: DC
  Administered 2019-11-14: 2 via NASAL
  Filled 2019-11-13: qty 16

## 2019-11-13 MED ORDER — IBUPROFEN 600 MG PO TABS
600.0000 mg | ORAL_TABLET | Freq: Four times a day (QID) | ORAL | Status: DC
  Administered 2019-11-13 – 2019-11-15 (×7): 600 mg via ORAL
  Filled 2019-11-13 (×9): qty 1

## 2019-11-13 MED ORDER — WITCH HAZEL-GLYCERIN EX PADS
1.0000 "application " | MEDICATED_PAD | CUTANEOUS | Status: DC
  Administered 2019-11-14: 1 via TOPICAL
  Filled 2019-11-13: qty 100

## 2019-11-13 MED ORDER — DIBUCAINE (PERIANAL) 1 % EX OINT
1.0000 "application " | TOPICAL_OINTMENT | CUTANEOUS | Status: DC | PRN
  Filled 2019-11-13: qty 28

## 2019-11-13 MED ORDER — ACETAMINOPHEN 500 MG PO TABS
1000.0000 mg | ORAL_TABLET | Freq: Four times a day (QID) | ORAL | Status: DC | PRN
  Administered 2019-11-13 – 2019-11-14 (×2): 1000 mg via ORAL
  Filled 2019-11-13 (×2): qty 2

## 2019-11-13 NOTE — Discharge Summary (Signed)
Obstetrical Discharge Summary  Patient Name: Gina Parsons DOB: 16-Aug-1995 MRN: [id number]  Date of Admission: 11/12/2019 Date of Delivery: 11/13/2019 Delivered by: Margaretmary Eddy, CNM  Date of Discharge: 11/15/2019  Primary OB: [facility]  [id number] last menstrual period was 02/20/2019 (approximate). EDC Estimated Date of Delivery: 11/27/19 Gestational Age at Delivery: [redacted]w[redacted]d   Antepartum complications:  1. Gestational HTN 2. History of anxiety  3. Rh negative  4. Prepregnancy BMI 34  Admitting Diagnosis: IOL for Gestational HTN  Secondary Diagnosis: Patient Active Problem List   Diagnosis Date Noted  . Hypertension, postpartum condition or complication 11/15/2019  . NSVD (normal spontaneous vaginal delivery) 11/15/2019  . Supervision of high risk pregnancy, unspecified, third trimester 06/10/2019    Induction: AROM, Pitocin, Cytotec and IP Foley Complications: None Intrapartum complications/course: magnesium sulfate infusion started for severe range blood pressures, treated with antihypertensives, see delivery note  Delivery Type: spontaneous vaginal delivery Anesthesia: epidural Placenta: spontaneous Laceration: 1st degree right vaginal sidewall, right labial  Episiotomy: none Newborn Data:  Live born female "[place]" Birth Weight:  6lbs 7.7oz, 2940g APGAR: 8,9   Newborn Delivery   Birth date/time: 11/13/2019 00:29:00 Delivery type: Vaginal, Spontaneous      Postpartum Procedures: Adjustment of antihypertensive medication Edinburgh:  Edinburgh Postnatal Depression Scale Screening Tool 11/14/2019 11/14/2019  I have been able to laugh and see the funny side of things. 0 (No Data)  I have looked forward with enjoyment to things. 0 -  I have blamed myself unnecessarily when things went wrong. 1 -  I have been anxious or worried for no good reason. 0 -  I have felt scared or panicky for no good reason. 1 -  Things have been getting on top of me. 1 -  I have  been so unhappy that I have had difficulty sleeping. 0 -  I have felt sad or miserable. 0 -  I have been so unhappy that I have been crying. 0 -  The thought of harming myself has occurred to me. 0 -  Edinburgh Postnatal Depression Scale Total 3 -     Post partum course:  Gina Parsons had magnesium sulfate infusion for 24 hours postpartum. By time of discharge on PPD#2, her pain was controlled on oral pain medications; she had appropriate lochia and was ambulating, voiding without difficulty, and tolerating regular diet.  She was deemed stable for discharge to home.    Discharge Physical Exam:  BP 139/81 (BP Location: Right Arm)   Pulse 76   Temp 99.5 F (37.5 C) (Oral)   Resp (!) 22   Ht 5\' 6"  (1.676 m)   Wt 97.1 kg   LMP 02/20/2019 (Approximate)   SpO2 100%   Breastfeeding Unknown   BMI 34.54 kg/m   Vitals with BMI 11/15/2019 11/15/2019 11/15/2019  Height - - -  Weight - - -  BMI - - -  Systolic 139 136 (No Data)  Diastolic 81 74 (No Data)  Pulse 76 85 -    General: NAD CV: RRR Pulm: CTABL, nl effort ABD: s/nd/nt, fundus firm and below the umbilicus Lochia: moderate Perineum:well healed DVT Evaluation: LE non-ttp, no evidence of DVT on exam.  Hemoglobin  Date Value Ref Range Status  11/14/2019 8.1 (L) 12.0 - 15.0 g/dL Final   HCT  Date Value Ref Range Status  11/14/2019 25.8 (L) 36 - 46 % Final     Disposition: stable, discharge to home. Baby Feeding: breastmilk Baby Disposition: home with mom  Rh Immune  globulin given: Maternal O neg/Baby A neg Rubella vaccine given: Immune Varivax vaccine given: Immune  Flu vaccine given in AP or PP setting: declined  Tdap vaccine given in AP or PP setting: declined   Contraception: TBD  Prenatal Labs:  Blood type/Rh O neg   Antibody screen neg  Rubella Immune  Varicella Immune  RPR NR  HBsAg Neg  HIV NR  GC neg  Chlamydia neg  Genetic screening negative  1 hour GTT 85  3 hour GTT N/A  GBS Negative    Plan:   Gina Parsons was discharged to home in good condition. Follow-up appointment with delivering provider in 6 weeks.  Discharge Medications: Allergies as of 11/15/2019   No Known Allergies     Medication List    TAKE these medications   fluticasone 50 MCG/ACT nasal spray Commonly known as: FLONASE Place 2 sprays daily into both nostrils.   labetalol 200 MG tablet Commonly known as: NORMODYNE Take 1 tablet (200 mg total) by mouth 2 (two) times daily.   multivitamin-prenatal 27-0.8 MG Tabs tablet Take 1 tablet by mouth daily.   NIFEdipine 60 MG 24 hr tablet Commonly known as: ADALAT CC Take 1 tablet (60 mg total) by mouth daily. Start taking on: November 16, 2019        Follow-up Information    [facility]. Go on 11/25/2019.   Why: blood pressure check @ 1:45 pm  Contact information: [address] [place] [address] [phone]       [name], CNM. Schedule an appointment as soon as possible for a visit in 6 week(s).   Specialty: Certified Nurse Midwife Why: Postpartum visit  Contact information: [address] [place] [address] [phone]               Signed: Cyril Mourning 11/15/2019 5:41 PM

## 2019-11-13 NOTE — Lactation Note (Signed)
This note was copied from a baby's chart. Lactation Consultation Note  Patient Name: Gina Parsons [id number] Date: 11/13/2019 Reason for consult: Follow-up assessment;1st time breastfeeding;Early term 37-38.6wks  LC called in to assist with waking baby and attempting a feeding. Baby sleepy- LC removed blankets, and began stimulation efforts, after 5-7 minutes baby began to stick out her tongue, moving head side to side. LC demonstrated hand expression to mom, colostrum easily expressed, and explained how to use this as a technique to encourage latch. Baby brought to mom/breast in cradle position, LC supported breast tissue in sandwich hold and rubbed nipple from nose to chin; baby opened and accepted breast and nipple deep into mouth and began strong rhythmic sucking pattern with intermittent swallows heard and visually seen through deepening of the chin/jaw.  Demonstrated continued breast massage and compression to aid in moving colostrum for baby, tips to keep baby stimulated when feeding for adequate emptying. Reviewed signs of transfer and when baby is full, and when to offer the second breast.  LC was bedside for 11 minutes of feeding; baby continuing to breastfeed when left. Mom asked to keep time of length of feeding. Encouraged to call out for ongoing breastfeeding support.  Maternal Data Formula Feeding for Exclusion: No Has patient been taught Hand Expression?: Yes (LC demonstrated this time) Does the patient have breastfeeding experience prior to this delivery?: No  Feeding Feeding Type: Breast Fed  LATCH Score Latch: Grasps breast easily, tongue down, lips flanged, rhythmical sucking.  Audible Swallowing: A few with stimulation  Type of Nipple: Everted at rest and after stimulation  Comfort (Breast/Nipple): Soft / non-tender  Hold (Positioning): Assistance needed to correctly position infant at breast and maintain latch.  LATCH Score: 8  Interventions Interventions:  Breast feeding basics reviewed;Assisted with latch;Breast massage;Hand express;Breast compression;Support pillows  Lactation Tools Discussed/Used     Consult Status Consult Status: PRN    Danford Bad 11/13/2019, 3:04 PM

## 2019-11-13 NOTE — Progress Notes (Addendum)
Post Partum Day 0  Subjective: Doing well, no concerns. Ambulating without difficulty, pain managed with PO meds, and tolerating regular diet.   No fever/chills, chest pain, shortness of breath, nausea/vomiting, or leg pain. No nipple or breast pain. No headache, visual changes, or RUQ/epigastric pain.  Objective: BP (!) 150/69   Pulse 81   Temp 97.7 F (36.5 C) (Oral)   Resp 18   Ht 5\' 6"  (1.676 m)   Wt 97.1 kg   LMP 02/20/2019 (Approximate)   SpO2 100%   Breastfeeding Unknown   BMI 34.54 kg/m    Physical Exam:  General: alert, cooperative, appears stated age and no distress Breasts: soft/nontender CV: RRR Pulm: nl effort, CTABL Abdomen: soft, non-tender, active bowel sounds Uterine Fundus: firm Lochia: appropriate DVT Evaluation: No evidence of DVT seen on physical exam. No cords or calf tenderness. No significant calf/ankle edema.  Recent Labs    11/12/19 1942 11/13/19 0627  HGB 9.3* 9.5*  HCT 29.6* 29.3*  WBC 9.8 21.9*  PLT 233 273    Assessment/Plan: 24 y.o. G1P1001 postpartum day # 0  -Continue routine postpartum care -Lactation consult PRN for breastfeeding  -Leukocytosis: Likely related to postpartum state; afebrile, no fundal tenderness. Repeat CBC ordered for tomorrow morning.  -Immunization status: all immunizations up to date -Gestational hypertension: Blood pressures are normal to mild range. Continue magnesium sulfate until 24 hours postpartum.   Disposition: Continue inpatient postpartum care    LOS: 1 day   11/15/19, CNM 11/13/2019, 12:11 PM   ----- 11/15/2019 Certified Nurse Midwife [facility]

## 2019-11-13 NOTE — Lactation Note (Signed)
This note was copied from a baby's chart. Lactation Consultation Note  Patient Name: Gina Parsons Today's Date: 11/13/2019 Reason for consult: Initial assessment;1st time breastfeeding;Early term 37-38.6wks (IOL for [facility])  Lactation in to see mom and baby [place]. Mom was induced due to [facility], delivered SVD 11 hrs ago. Mom remains in LDR1 due to Mag2++ being given for 24hrs.  Mom has breastfed 2x since delivery, with normal lengths, and reports baby active throughout the feeding. Attempts have been made since last breastfeeding session to give a bottle; baby did not accept. At least baby is resting comfortably with visitor, and mom has questions re: baby getting enough.  LC reviewed with mom newborn stomach size and feeding patterns for first 24 hours. Encouraged and reviewed benefits of frequent attempts, and skin to skin to help awaken baby, and encourage feedings. Reviewed early feeding cues, and output expectations.  Supply and demand briefly reviewed, and mom encouraged to offer breast before bottle to help in establishing an adequate supply for baby as she grows. Mom feels all her questions are answered; LC wrote name/number on whiteboard in LDR1, for mom to call with additional questions or for breastfeeding assistance and support.  Maternal Data Formula Feeding for Exclusion: No Has patient been taught Hand Expression?: Yes (mom reports RN taught her) Does the patient have breastfeeding experience prior to this delivery?: No  Feeding    LATCH Score                   Interventions Interventions: Breast feeding basics reviewed  Lactation Tools Discussed/Used     Consult Status Consult Status: PRN    Danford Bad 11/13/2019, 12:07 PM

## 2019-11-13 NOTE — Progress Notes (Signed)
   11/13/19 1200  Clinical Encounter Type  Visited With Patient and family together  Visit Type Follow-up  Referral From Chaplain  Consult/Referral To Chaplain  Chaplain stopped in to visit with patient and take her an AD. Patient already has AD and will read it over. Chaplain visited with her and her sister along with [facility]. Before leaving chaplain ask if she could prayer with them and they said yes. Chaplain prayed, wished them well, and left.

## 2019-11-14 LAB — CBC
HCT: 25.8 % — ABNORMAL LOW (ref 36.0–46.0)
Hemoglobin: 8.1 g/dL — ABNORMAL LOW (ref 12.0–15.0)
MCH: 25.5 pg — ABNORMAL LOW (ref 26.0–34.0)
MCHC: 31.4 g/dL (ref 30.0–36.0)
MCV: 81.1 fL (ref 80.0–100.0)
Platelets: 283 10*3/uL (ref 150–400)
RBC: 3.18 MIL/uL — ABNORMAL LOW (ref 3.87–5.11)
RDW: 14.6 % (ref 11.5–15.5)
WBC: 14.3 10*3/uL — ABNORMAL HIGH (ref 4.0–10.5)
nRBC: 0 % (ref 0.0–0.2)

## 2019-11-14 LAB — COMPREHENSIVE METABOLIC PANEL
ALT: 16 U/L (ref 0–44)
AST: 35 U/L (ref 15–41)
Albumin: 2.5 g/dL — ABNORMAL LOW (ref 3.5–5.0)
Alkaline Phosphatase: 68 U/L (ref 38–126)
Anion gap: 7 (ref 5–15)
BUN: 5 mg/dL — ABNORMAL LOW (ref 6–20)
CO2: 24 mmol/L (ref 22–32)
Calcium: 7.1 mg/dL — ABNORMAL LOW (ref 8.9–10.3)
Chloride: 105 mmol/L (ref 98–111)
Creatinine, Ser: 0.67 mg/dL (ref 0.44–1.00)
GFR calc Af Amer: >60 mL/min (ref >60)
GFR calc non Af Amer: >60 mL/min (ref >60)
Glucose, Bld: 70 mg/dL (ref 70–99)
Potassium: 3.7 mmol/L (ref 3.5–5.1)
Sodium: 136 mmol/L (ref 135–145)
Total Bilirubin: 0.6 mg/dL (ref 0.3–1.2)
Total Protein: 5.5 g/dL — ABNORMAL LOW (ref 6.5–8.1)

## 2019-11-14 NOTE — Lactation Note (Signed)
This note was copied from a baby's chart. Lactation Consultation Note  Patient Name: Gina Parsons [id number] Date: 11/14/2019 Reason for consult: Follow-up assessment;1st time breastfeeding;Early term 37-38.6wks  Lactation follow-up. Mom now on MBU after having Mag2++ for 24hrs. Mom has continued to breastfeed on demand, starting to feel some soreness. LC encouraged to use coconut oil to help with soreness/tenderness, reviewed transient nipple pain and discomfort. Reviewed newborn feeding patterns, growth spurts/cluster feeding, output expectations. Encouraged to reach out today for lactation support as needed.  Maternal Data Formula Feeding for Exclusion: No Has patient been taught Hand Expression?: Yes Does the patient have breastfeeding experience prior to this delivery?: No  Feeding    LATCH Score                   Interventions Interventions: Breast feeding basics reviewed  Lactation Tools Discussed/Used     Consult Status Consult Status: PRN    Danford Bad 11/14/2019, 11:09 AM

## 2019-11-14 NOTE — Progress Notes (Signed)
Post Partum Day 1 Subjective: Doing well, no complaints.  Tolerating regular diet, pain with PO meds, voiding and ambulating without difficulty.  No CP SOB Fever,Chills, N/V or leg pain; denies nipple or breast pain, no HA change of vision, RUQ/epigastric pain  - notified by nursing of severe range BP this am, given Procardia 60mg  XL as ordered at 0812, repeat BP remained in severe range. - given Labetalol 20mg  IV with return to mild range value 149/92.  - Now BP again in severe range.   Objective: BP (!) 151/95 (BP Location: Right Arm)   Pulse 60   Temp 98.7 F (37.1 C) (Oral)   Resp 18   Ht 5\' 6"  (1.676 m)   Wt 97.1 kg   LMP 02/20/2019 (Approximate)   SpO2 99%   Breastfeeding Unknown   BMI 34.54 kg/m    Physical Exam:  General: NAD Breasts: soft/nontender CV: RRR Pulm: nl effort, CTABL Abdomen: soft, NT, BS x 4 Perineum: minimal edema, laceration repair well approximated Lochia: moderate Uterine Fundus: fundus firm and 1 fb below umbilicus DVT Evaluation: no cords, ttp LEs, 1+ edema, no clonus, 2+ DTRs  Recent Labs    11/13/19 0627 11/14/19 0544  HGB 9.5* 8.1*  HCT 29.3* 25.8*  WBC 21.9* 14.3*  PLT 273 283    Assessment/Plan: 24 y.o. G1P1001 postpartum day # 1  - Continue routine PP care - Lactation consult prn  - GHTN severe, s/p Mag sulfate. Started on Procardia this am, 1st dose given at [phone]; repeat BP severe range and given Labetalol 20mg  IV at 0845. BP again severe range now with repeat 151/95, d/w Dr 11/15/19.  - CMP repeat WNL  - strict I/O discussed with nursing, pt has voided 11/16/19 since 0830. - chronic anemia - hemodynamically stable and asymptomatic; continue po ferrous sulfate BID with stool softeners      Disposition: Does not desire Dc home today.     2620, CNM 11/14/2019  11:33 AM

## 2019-11-14 NOTE — Progress Notes (Signed)
  MEDICATION RELATED CONSULT NOTE - INITIAL   Pharmacy Consult for Postpartum Meds to Bed Indication: Hypertension  No Known Allergies  Vital Signs: Temp: 98.7 F (37.1 C) (07/15 1546) Temp Source: Oral (07/15 1546) BP: 143/79 (07/15 1546) Pulse Rate: 67 (07/15 1546)   Medical History: Past Medical History:  Diagnosis Date  . Migraines     Assessment: Postpartum patient with hypertension identified for meds to bed program. Patient is currently on Nifedipine XL for blood pressure.  Plan:  Met with patient and provided information for meds to bed program. Patient stated that she would like to use her usual pharmacy at discharge and declined this service. Provided patient with information about Nifedipine XL.   Paulina Fusi, PharmD, BCPS 11/14/2019 4:42 PM

## 2019-11-14 NOTE — Anesthesia Postprocedure Evaluation (Signed)
Anesthesia Post Note  Patient: Gina Parsons  Procedure(s) Performed: AN AD HOC LABOR EPIDURAL  Patient location during evaluation: Mother Baby Anesthesia Type: Epidural Level of consciousness: oriented and awake and alert Pain management: pain level controlled Vital Signs Assessment: post-procedure vital signs reviewed and stable Respiratory status: spontaneous breathing and respiratory function stable Cardiovascular status: blood pressure returned to baseline and stable Postop Assessment: no headache, no backache, no apparent nausea or vomiting and able to ambulate Anesthetic complications: no   No complications documented.   Last Vitals:  Vitals:   11/14/19 0253 11/14/19 0330  BP: (!) 154/84 (!) 141/88  Pulse: 98 77  Resp:  18  Temp:  36.9 C  SpO2:  100%    Last Pain:  Vitals:   11/14/19 0330  TempSrc: Oral  PainSc: 0-No pain                 Starling Manns

## 2019-11-15 DIAGNOSIS — O165 Unspecified maternal hypertension, complicating the puerperium: Secondary | ICD-10-CM | POA: Diagnosis present

## 2019-11-15 MED ORDER — LABETALOL HCL 200 MG PO TABS: 200.0000 mg | ORAL_TABLET | Freq: Two times a day (BID) | ORAL | 11 refills | Status: AC

## 2019-11-15 MED ORDER — NIFEDIPINE ER 60 MG PO TB24: 60.0000 mg | ORAL_TABLET | Freq: Every day | ORAL | 11 refills | Status: AC

## 2019-11-15 MED ORDER — LABETALOL HCL 200 MG PO TABS
200.0000 mg | ORAL_TABLET | Freq: Two times a day (BID) | ORAL | Status: DC
  Administered 2019-11-15: 200 mg via ORAL
  Filled 2019-11-15: qty 1

## 2019-11-15 NOTE — Progress Notes (Signed)
No urine output was measured by RN last night. Per patient she voided 3 times last night "a good amount" but emptied it herself. Urine hat currently empty. Reinforced patient to call when she voids so we can measure her output.

## 2019-11-15 NOTE — Progress Notes (Signed)
Patient discharged home with infant. Discharge instructions and prescriptions given and reviewed with patient. Patient verbalized understanding. Escorted out by auxillary.  

## 2019-11-15 NOTE — Lactation Note (Signed)
This note was copied from a baby's chart. Lactation Consultation Note  Patient Name: Gina Parsons Today's Date: 11/15/2019     Maternal Data  MOm has not breastfed baby since 0800, states she gave formula because baby was fussy at breast and she felt baby didn't want the breast and was hungry, I reassured her that this was normal behavior for a newborn, gassiness etc, mom is unsure if she wants to continue to breastfeed, I informed her that I would help her with getting baby back to breast if she desired, also informed her about pumping breasts if she would rather do that.  She may call her insurance co. Today to find out about breast pump coverage, she will decide if she wants to continue to breastfeed.  May be d/c'd this pm if blood pressure is controlled.     Feeding Feeding Type: Bottle Fed - Formula Nipple Type: Slow - flow  LATCH Score  did not observe a feeding                 Interventions  LC name and no written on white board  Lactation Tools Discussed/Used     Consult Status  prn    Dyann Kief 11/15/2019, 1:16 PM

## 2019-11-15 NOTE — Discharge Instructions (Signed)
Vaginal Delivery, Care After Refer to this sheet in the next few weeks. These discharge instructions provide you with information on caring for yourself after delivery. Your caregiver may also give you specific instructions. Your treatment has been planned according to the most current medical practices available, but problems sometimes occur. Call your caregiver if you have any problems or questions after you go home. HOME CARE INSTRUCTIONS 1. Take over-the-counter or prescription medicines only as directed by your caregiver or pharmacist. 2. Do not drink alcohol, especially if you are breastfeeding or taking medicine to relieve pain. 3. Do not smoke tobacco. 4. Continue to use good perineal care. Good perineal care includes: 1. Wiping your perineum from back to front 2. Keeping your perineum clean. 3. You can do sitz baths twice a day, to help keep this area clean 5. Do not use tampons, douche or have sex until your caregiver says it is okay. 6. Shower only and avoid sitting in submerged water, aside from sitz baths 7. Wear a well-fitting bra that provides breast support. 8. Eat healthy foods. 9. Drink enough fluids to keep your urine clear or pale yellow. 10. Eat high-fiber foods such as whole grain cereals and breads, brown rice, beans, and fresh fruits and vegetables every day. These foods may help prevent or relieve constipation. 11. Avoid constipation with high fiber foods or medications, such as miralax or metamucil 12. Follow your caregiver's recommendations regarding resumption of activities such as climbing stairs, driving, lifting, exercising, or traveling. 13. Talk to your caregiver about resuming sexual activities. Resumption of sexual activities is dependent upon your risk of infection, your rate of healing, and your comfort and desire to resume sexual activity. 14. Try to have someone help you with your household activities and your newborn for at least a few days after you leave  the hospital. 15. Rest as much as possible. Try to rest or take a nap when your newborn is sleeping. 16. Increase your activities gradually. 17. Keep all of your scheduled postpartum appointments. It is very important to keep your scheduled follow-up appointments. At these appointments, your caregiver will be checking to make sure that you are healing physically and emotionally. SEEK MEDICAL CARE IF:   You are passing large clots from your vagina. Save any clots to show your caregiver.  You have a foul smelling discharge from your vagina.  You have trouble urinating.  You are urinating frequently.  You have pain when you urinate.  You have a change in your bowel movements.  You have increasing redness, pain, or swelling near your vaginal incision (episiotomy) or vaginal tear.  You have pus draining from your episiotomy or vaginal tear.  Your episiotomy or vaginal tear is separating.  You have painful, hard, or reddened breasts.  You have a severe headache.  You have blurred vision or see spots.  You feel sad or depressed.  You have thoughts of hurting yourself or your newborn.  You have questions about your care, the care of your newborn, or medicines.  You are dizzy or light-headed.  You have a rash.  You have nausea or vomiting.  You were breastfeeding and have not had a menstrual period within 12 weeks after you stopped breastfeeding.  You are not breastfeeding and have not had a menstrual period by the 12th week after delivery.  You have a fever. SEEK IMMEDIATE MEDICAL CARE IF:   You have persistent pain.  You have chest pain.  You have shortness of breath.    You faint.  You have leg pain.  You have stomach pain.  Your vaginal bleeding saturates two or more sanitary pads in 1 hour. MAKE SURE YOU:   Understand these instructions.  Will watch your condition.  Will get help right away if you are not doing well or get worse. Document Released:  04/15/2000 Document Revised: 09/02/2013 Document Reviewed: 12/14/2011 [facility] Patient Information 2015 [place], [place]. This information is not intended to replace advice given to you by your health care provider. Make sure you discuss any questions you have with your health care provider.  Sitz Bath A sitz bath is a warm water bath taken in the sitting position. The water covers only the hips and butt (buttocks). We recommend using one that fits in the toilet, to help with ease of use and cleanliness. It may be used for either healing or cleaning purposes. Sitz baths are also used to relieve pain, itching, or muscle tightening (spasms). The water may contain medicine. Moist heat will help you heal and relax.  HOME CARE  Take 3 to 4 sitz baths a day. 18. Fill the bathtub half-full with warm water. 19. Sit in the water and open the drain a little. 20. Turn on the warm water to keep the tub half-full. Keep the water running constantly. 21. Soak in the water for 15 to 20 minutes. 22. After the sitz bath, pat the affected area dry. GET HELP RIGHT AWAY IF: You get worse instead of better. Stop the sitz baths if you get worse. MAKE SURE YOU:  Understand these instructions.  Will watch your condition.  Will get help right away if you are not doing well or get worse. Document Released: 05/26/2004 Document Revised: 01/11/2012 Document Reviewed: 08/16/2010 [facility] Patient Information 2015 [place], [place]. This information is not intended to replace advice given to you by your health care provider. Make sure you discuss any questions you have with your health care provider.    Postpartum Hypertension Postpartum hypertension is high blood pressure that remains higher than normal after childbirth. You may not realize that you have postpartum hypertension if your blood pressure is not being checked regularly. In most cases, postpartum hypertension will go away on its own, usually within a week of  delivery. However, for some women, medical treatment is required to prevent serious complications, such as seizures or stroke. What are the causes? This condition may be caused by one or more of the following:  Hypertension that existed before pregnancy (chronic hypertension).  Hypertension that comes on as a result of pregnancy (gestational hypertension).  Hypertensive disorders during pregnancy (preeclampsia) or seizures in women who have high blood pressure during pregnancy (eclampsia).  A condition in which the liver, platelets, and red blood cells are damaged during pregnancy (HELLP syndrome).  A condition in which the thyroid produces too much hormones (hyperthyroidism).  Other rare problems of the nerves (neurological disorders) or blood disorders. In some cases, the cause may not be known. What increases the risk? The following factors may make you more likely to develop this condition:  Chronic hypertension. In some cases, this may not have been diagnosed before pregnancy.  Obesity.  Type 2 diabetes.  Kidney disease.  History of preeclampsia or eclampsia.  Other medical conditions that change the level of hormones in the body (hormonal imbalance). What are the signs or symptoms? As with all types of hypertension, postpartum hypertension may not have any symptoms. Depending on how high your blood pressure is, you may experience:  Headaches. These may be mild, moderate, or severe. They may also be steady, constant, or sudden in onset (thunderclap headache).  Changes in your ability to see (visual changes).  Dizziness.  Shortness of breath.  Swelling of your hands, feet, lower legs, or face. In some cases, you may have swelling in more than one of these locations.  Heart palpitations or a racing heartbeat.  Difficulty breathing while lying down.  Decrease in the amount of urine that you pass. Other rare signs and symptoms may include:  Sweating more than usual.  This lasts longer than a few days after delivery.  Chest pain.  Sudden dizziness when you get up from sitting or lying down.  Seizures.  Nausea or vomiting.  Abdominal pain. How is this diagnosed? This condition may be diagnosed based on the results of a physical exam, blood pressure measurements, and blood and urine tests. You may also have other tests, such as a CT scan or an MRI, to check for other problems of postpartum hypertension. How is this treated? If blood pressure is high enough to require treatment, your options may include:  Medicines to reduce blood pressure (antihypertensives). Tell your health care provider if you are breastfeeding or if you plan to breastfeed. There are many antihypertensive medicines that are safe to take while breastfeeding.  Stopping medicines that may be causing hypertension.  Treating medical conditions that are causing hypertension.  Treating the complications of hypertension, such as seizures, stroke, or kidney problems. Your health care provider will also continue to monitor your blood pressure closely until it is within a safe range for you. Follow these instructions at home:  Take over-the-counter and prescription medicines only as told by your health care provider.  Return to your normal activities as told by your health care provider. Ask your health care provider what activities are safe for you.  Do not use any products that contain nicotine or tobacco, such as cigarettes and e-cigarettes. If you need help quitting, ask your health care provider.  Keep all follow-up visits as told by your health care provider. This is important. Contact a health care provider if:  Your symptoms get worse.  You have new symptoms, such as: ? A headache that does not get better. ? Dizziness. ? Visual changes. Get help right away if:  You suddenly develop swelling in your hands, ankles, or face.  You have sudden, rapid weight gain.  You  develop difficulty breathing, chest pain, racing heartbeat, or heart palpitations.  You develop severe pain in your abdomen.  You have any symptoms of a stroke. "BE FAST" is an easy way to remember the main warning signs of a stroke: ? B - Balance. Signs are dizziness, sudden trouble walking, or loss of balance. ? E - Eyes. Signs are trouble seeing or a sudden change in vision. ? F - Face. Signs are sudden weakness or numbness of the face, or the face or eyelid drooping on one side. ? A - Arms. Signs are weakness or numbness in an arm. This happens suddenly and usually on one side of the body. ? S - Speech. Signs are sudden trouble speaking, slurred speech, or trouble understanding what people say. ? T - Time. Time to call emergency services. Write down what time symptoms started.  You have other signs of a stroke, such as: ? A sudden, severe headache with no known cause. ? Nausea or vomiting. ? Seizure. These symptoms may represent a serious problem that is an emergency.  Do not wait to see if the symptoms will go away. Get medical help right away. Call your local emergency services (911 in the [place] Do not drive yourself to the hospital. Summary  Postpartum hypertension is high blood pressure that remains higher than normal after childbirth.  In most cases, postpartum hypertension will go away on its own, usually within a week of delivery.  For some women, medical treatment is required to prevent serious complications, such as seizures or stroke. This information is not intended to replace advice given to you by your health care provider. Make sure you discuss any questions you have with your health care provider. Document Revised: 05/25/2018 Document Reviewed: 02/06/2017 [employer] Patient Education  2020 [employer].
